# Patient Record
Sex: Female | Born: 1965 | Race: Asian | Hispanic: No | Marital: Married | Smoking: Never smoker
Health system: Southern US, Community
[De-identification: ages and names within clinical notes are randomized; demographics above are authoritative.]

## PROBLEM LIST (undated history)

## (undated) DIAGNOSIS — L659 Nonscarring hair loss, unspecified: Secondary | ICD-10-CM

## (undated) DIAGNOSIS — N838 Other noninflammatory disorders of ovary, fallopian tube and broad ligament: Secondary | ICD-10-CM

## (undated) DIAGNOSIS — E559 Vitamin D deficiency, unspecified: Secondary | ICD-10-CM

## (undated) DIAGNOSIS — R5383 Other fatigue: Secondary | ICD-10-CM

## (undated) DIAGNOSIS — F32A Depression, unspecified: Secondary | ICD-10-CM

## (undated) DIAGNOSIS — N926 Irregular menstruation, unspecified: Secondary | ICD-10-CM

## (undated) DIAGNOSIS — Z86018 Personal history of other benign neoplasm: Secondary | ICD-10-CM

## (undated) DIAGNOSIS — F329 Major depressive disorder, single episode, unspecified: Secondary | ICD-10-CM

## (undated) DIAGNOSIS — F419 Anxiety disorder, unspecified: Secondary | ICD-10-CM

## (undated) DIAGNOSIS — D219 Benign neoplasm of connective and other soft tissue, unspecified: Secondary | ICD-10-CM

## (undated) HISTORY — DX: Personal history of other benign neoplasm: Z86.018

## (undated) HISTORY — DX: Irregular menstruation, unspecified: N92.6

## (undated) HISTORY — DX: Depression, unspecified: F32.A

## (undated) HISTORY — PX: OTHER SURGICAL HISTORY: SHX169

## (undated) HISTORY — DX: Major depressive disorder, single episode, unspecified: F32.9

## (undated) HISTORY — DX: Benign neoplasm of connective and other soft tissue, unspecified: D21.9

## (undated) HISTORY — DX: Anxiety disorder, unspecified: F41.9

## (undated) HISTORY — DX: Nonscarring hair loss, unspecified: L65.9

## (undated) HISTORY — DX: Other noninflammatory disorders of ovary, fallopian tube and broad ligament: N83.8

## (undated) HISTORY — DX: Other fatigue: R53.83

## (undated) HISTORY — DX: Vitamin D deficiency, unspecified: E55.9

---

## 2007-10-17 ENCOUNTER — Ambulatory Visit: Payer: Self-pay | Admitting: Internal Medicine

## 2011-07-04 HISTORY — PX: LAPAROSCOPY: SHX197

## 2012-12-14 LAB — HM PAP SMEAR: HM Pap smear: NORMAL

## 2012-12-25 ENCOUNTER — Ambulatory Visit: Payer: Self-pay | Admitting: Obstetrics and Gynecology

## 2013-01-07 ENCOUNTER — Ambulatory Visit: Payer: Self-pay | Admitting: Obstetrics and Gynecology

## 2014-05-06 ENCOUNTER — Ambulatory Visit: Payer: Self-pay | Admitting: Obstetrics and Gynecology

## 2014-10-20 ENCOUNTER — Ambulatory Visit
Admit: 2014-10-20 | Disposition: A | Discharge status: Home / Self Care | Payer: Self-pay | Attending: Gastroenterology | Admitting: Gastroenterology

## 2014-10-22 ENCOUNTER — Other Ambulatory Visit: Payer: Self-pay | Admitting: Gastroenterology

## 2014-10-22 DIAGNOSIS — R140 Abdominal distension (gaseous): Secondary | ICD-10-CM

## 2014-10-26 LAB — SURGICAL PATHOLOGY

## 2014-10-27 DIAGNOSIS — L659 Nonscarring hair loss, unspecified: Secondary | ICD-10-CM | POA: Insufficient documentation

## 2014-10-27 DIAGNOSIS — D219 Benign neoplasm of connective and other soft tissue, unspecified: Secondary | ICD-10-CM | POA: Insufficient documentation

## 2014-10-27 DIAGNOSIS — R5383 Other fatigue: Secondary | ICD-10-CM | POA: Insufficient documentation

## 2014-11-03 ENCOUNTER — Ambulatory Visit
Admission: RE | Admit: 2014-11-03 | Discharge: 2014-11-03 | Disposition: A | Discharge status: Home / Self Care | Payer: 59 | Source: Ambulatory Visit | Attending: Gastroenterology | Admitting: Gastroenterology

## 2014-11-03 DIAGNOSIS — R140 Abdominal distension (gaseous): Secondary | ICD-10-CM | POA: Insufficient documentation

## 2014-11-03 DIAGNOSIS — R933 Abnormal findings on diagnostic imaging of other parts of digestive tract: Secondary | ICD-10-CM | POA: Insufficient documentation

## 2014-11-03 MED ORDER — TECHNETIUM TC 99M SULFUR COLLOID
2.0000 | Freq: Once | INTRAVENOUS | Status: AC | PRN
  Administered 2014-11-03: 2.08 via ORAL

## 2015-02-01 LAB — HM MAMMOGRAPHY: HM Mammogram: NORMAL

## 2015-02-14 DIAGNOSIS — K3184 Gastroparesis: Secondary | ICD-10-CM | POA: Insufficient documentation

## 2015-09-22 ENCOUNTER — Encounter: Payer: Self-pay | Admitting: Obstetrics and Gynecology

## 2015-09-22 ENCOUNTER — Ambulatory Visit (INDEPENDENT_AMBULATORY_CARE_PROVIDER_SITE_OTHER): Payer: BLUE CROSS/BLUE SHIELD | Admitting: Obstetrics and Gynecology

## 2015-09-22 VITALS — BP 121/85 | HR 112 | Ht 60.0 in | Wt 161.3 lb

## 2015-09-22 DIAGNOSIS — Z01419 Encounter for gynecological examination (general) (routine) without abnormal findings: Secondary | ICD-10-CM

## 2015-09-22 DIAGNOSIS — B373 Candidiasis of vulva and vagina: Secondary | ICD-10-CM

## 2015-09-22 DIAGNOSIS — B3731 Acute candidiasis of vulva and vagina: Secondary | ICD-10-CM

## 2015-09-22 DIAGNOSIS — N951 Menopausal and female climacteric states: Secondary | ICD-10-CM | POA: Insufficient documentation

## 2015-09-22 DIAGNOSIS — Z1211 Encounter for screening for malignant neoplasm of colon: Secondary | ICD-10-CM | POA: Diagnosis not present

## 2015-09-22 DIAGNOSIS — N952 Postmenopausal atrophic vaginitis: Secondary | ICD-10-CM

## 2015-09-22 DIAGNOSIS — Z0000 Encounter for general adult medical examination without abnormal findings: Secondary | ICD-10-CM

## 2015-09-22 MED ORDER — CONJ ESTROG-MEDROXYPROGEST ACE 0.625-2.5 MG PO TABS: 1.0000 | ORAL_TABLET | Freq: Every day | ORAL | Status: DC

## 2015-09-22 MED ORDER — FLUCONAZOLE 150 MG PO TABS: 150.0000 mg | ORAL_TABLET | Freq: Once | ORAL | Status: DC

## 2015-09-22 MED ORDER — FLUCONAZOLE 150 MG PO TABS
150.0000 mg | ORAL_TABLET | Freq: Once | ORAL | Status: DC
Start: 2015-09-22 — End: 2015-09-30

## 2015-09-22 NOTE — Patient Instructions (Signed)
1. Pap smear is done 2. Mammogram is ordered 3. Stool guaiac cards are given for colon cancer screening 4. Diflucan 150 mg orally 1 dose is given for yeast infection 5. Begin hormone therapy Prempro daily 6. Menstrual calendar monitoring to assess for abnormal uterine bleeding 7. Return in 2 months for follow-up 8. Return in 1 year for annual exam 9. Calcium with vitamin D twice a day is recommended.  Medication options include:   Calcium citrate   Citracal   Os-Cal    Viactive chews   Tums

## 2015-09-22 NOTE — Progress Notes (Signed)
GYN ENCOUNTER NOTE  Subjective:       Brenda Bruce is a 50 y.o. G74P1001 female is here for gynecologic evaluation of the following issues:  1. Perimenopausal vasomotor symptoms. 2.  Annual examination.  50 year old [place] female; regular cycles until August of this past year, then no menses until January 2017.  Patient is having significant vasomotor symptoms in the form of hot flashes and night sweats as well as pelvic discomfort with intercourse.  Patient is interested in hormone replacement therapy.    Gynecologic History Patient's last menstrual period was 07/13/2015 (approximate). Contraception: none Last Pap: 12/2012, normal Last mammogram:05/06/2014, BI-RADS 1 History of fibroid uterus; ultrasound 02/05/2013 demonstrating 2 fibroids, each measuring 2.7 centimeters  Obstetric History OB History  Gravida Para Term Preterm AB SAB TAB Ectopic Multiple Living  1 1 1            # Outcome Date GA Lbr Len/2nd Weight Sex Delivery Anes PTL Lv  1 Term 1989   8 lb (3.629 kg) F Vag-Spont         Past Medical History  Diagnosis Date  . Anxiety and depression   . Fatigue   . Fibroid tumor   . Hair loss   . Irregular menses   . Vitamin D deficiency   . History of uterine fibroid   . Ovarian mass, right     Past Surgical History  Procedure Laterality Date  . Intestional tumor      removed  . Laparoscopy  2013    remove tumor in uterus? Dr. in [facility]    No current outpatient prescriptions on file prior to visit.   No current facility-administered medications on file prior to visit.    No Known Allergies  Social History   Social History  . Marital Status: Married    Spouse Name: N/A  . Number of Children: N/A  . Years of Education: N/A   Occupational History  . Not on file.   Social History Main Topics  . Smoking status: Never Smoker   . Smokeless tobacco: Never Used  . Alcohol Use: No  . Drug Use: No  . Sexual Activity: Yes    Birth Control/ Protection: None    Other Topics Concern  . Not on file   Social History Narrative    No family history on file.  The following portions of the patient's history were reviewed and updated as appropriate: allergies, current medications, past family history, past medical history, past social history, past surgical history and problem list.  Review of Systems Review of Systems - General ROS: negative for - chills, fatigue, fever.  POSITIVE-, hot flashes, malaise or night sweats Hematological and Lymphatic ROS: negative for - bleeding problems or swollen lymph nodes Gastrointestinal ROS: negative for - abdominal pain, blood in stools, change in bowel habits and nausea/vomiting Musculoskeletal ROS: negative for - joint pain, muscle pain or muscular weakness Genito-Urinary ROS: negative for - change in menstrual cycle, dysmenorrhea, dyspareunia, dysuria, genital discharge, genital ulcers, hematuria, incontinence, irregular/heavy menses, nocturia or pelvic painjj  Objective:   BP 121/85 mmHg  Pulse 112  Ht 5' (1.524 m)  Wt 161 lb 5 oz (73.171 kg)  BMI 31.50 kg/m2  LMP 07/13/2015 (Approximate) CONSTITUTIONAL: Well-developed, well-nourished female in no acute distress.  HENT:  Normocephalic, atraumatic.  NECK: Normal range of motion, supple, no masses.  Normal thyroid.  SKIN: Skin is warm and dry. No rash noted. Not diaphoretic. No erythema. No pallor. [place]: Alert and oriented to person,  place, and time. PSYCHIATRIC: Normal mood and affect. Normal behavior. Normal judgment and thought content. CARDIOVASCULAR:Not Examined RESPIRATORY: Not Examined BREASTS: Not Examined ABDOMEN: Soft, non distended; Non tender.  No Organomegaly. PELVIC:  External Genitalia: Normal  BUS: Normal  Vagina: Normal; mild decreased estrogen effect; white cheesy discharge  Cervix: Normal; no lesions  Uterus: Normal size, shape,consistency, mobile  Adnexa: Normal  RV: Normal external exam; rectovaginal normal  Bladder:  Nontender MUSCULOSKELETAL: Normal range of motion. No tenderness.  No cyanosis, clubbing, or edema.     Assessment:   1. Well woman exam - Pap IG and HPV (high risk) DNA detection  2. Screening for colon cancer - Fecal Occult Blood, Guaiac  3. Perimenopausal vasomotor symptoms; symptomatic; patient desires HRT  . Vaginal atrophy  5. Yeast vaginitis    Plan:  1.  Begin Prempro 0.625/2.5 mg daily 2.  Return in 2 months for follow-up. 3.  Diflucan 150 mg orally x1 4.  Recommend calcium with vitamin D supplementation  5.  Pap smear completed. 6.   Mammogram ordered 7.  Return in 1 year for routine Annual exam  Duration of office visit was 1 hour with greater than 50% dedicated to counseling and  Management of health issues.. Interpreter  Was utilized.   Brayton Mars, MD'  Note: This dictation was prepared with Dragon dictation along with smaller phrase technology. Any transcriptional errors that result from this process are unintentional.

## 2015-09-24 ENCOUNTER — Other Ambulatory Visit: Payer: Self-pay | Admitting: Obstetrics and Gynecology

## 2015-09-26 LAB — PAP IG AND HPV HIGH-RISK
HPV, high-risk: POSITIVE — AB
PAP Smear Comment: 0

## 2015-09-30 ENCOUNTER — Telehealth: Payer: Self-pay | Admitting: Obstetrics and Gynecology

## 2015-09-30 MED ORDER — CONJ ESTROG-MEDROXYPROGEST ACE 0.625-2.5 MG PO TABS: 1.0000 | ORAL_TABLET | Freq: Every day | ORAL | Status: AC

## 2015-09-30 MED ORDER — FLUCONAZOLE 150 MG PO TABS: 150.0000 mg | ORAL_TABLET | Freq: Once | ORAL | Status: DC

## 2015-09-30 NOTE — Telephone Encounter (Signed)
error 

## 2015-09-30 NOTE — Addendum Note (Signed)
Addended by: Elouise Munroe on: 09/30/2015 02:28 PM   Modules accepted: Orders

## 2015-10-01 ENCOUNTER — Telehealth: Payer: Self-pay

## 2015-10-01 NOTE — Telephone Encounter (Signed)
Pt aware per language line [facility] [place] her f/u appt was changed to conf and colpo. Asked about calcium. Advsied 600mg  bid (oscal). On a side note- yesterday after lunch pts husband stop by office and informed me her rx from annual visit was not at pharmacy. He states he had called 4x and was told they were at pharmacy. Advised I never got a call/message. Meds erx. Pt states she did p/u meds.

## 2015-10-01 NOTE — Telephone Encounter (Signed)
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  Please Schedule colposcopy at patient's follow-up appt on HRT therapy in 2 months

## 2015-10-02 LAB — FECAL OCCULT BLOOD, IMMUNOCHEMICAL: Fecal Occult Bld: NEGATIVE

## 2015-11-24 ENCOUNTER — Encounter: Payer: BLUE CROSS/BLUE SHIELD | Admitting: Obstetrics and Gynecology

## 2015-11-24 ENCOUNTER — Other Ambulatory Visit: Payer: Self-pay | Admitting: Nurse Practitioner

## 2015-11-24 DIAGNOSIS — Z1231 Encounter for screening mammogram for malignant neoplasm of breast: Secondary | ICD-10-CM

## 2015-12-13 ENCOUNTER — Other Ambulatory Visit: Payer: Self-pay | Admitting: Nurse Practitioner

## 2015-12-13 ENCOUNTER — Ambulatory Visit
Admission: RE | Admit: 2015-12-13 | Discharge: 2015-12-13 | Disposition: A | Discharge status: Home / Self Care | Payer: BLUE CROSS/BLUE SHIELD | Source: Ambulatory Visit | Attending: Nurse Practitioner | Admitting: Nurse Practitioner

## 2015-12-13 DIAGNOSIS — Z1231 Encounter for screening mammogram for malignant neoplasm of breast: Secondary | ICD-10-CM | POA: Diagnosis not present

## 2016-09-13 ENCOUNTER — Other Ambulatory Visit: Payer: Self-pay | Admitting: Nurse Practitioner

## 2016-09-13 DIAGNOSIS — Z1231 Encounter for screening mammogram for malignant neoplasm of breast: Secondary | ICD-10-CM

## 2016-12-19 ENCOUNTER — Ambulatory Visit
Admission: RE | Admit: 2016-12-19 | Discharge: 2016-12-19 | Disposition: A | Discharge status: Home / Self Care | Payer: BLUE CROSS/BLUE SHIELD | Source: Ambulatory Visit | Attending: Nurse Practitioner | Admitting: Nurse Practitioner

## 2016-12-19 DIAGNOSIS — Z1231 Encounter for screening mammogram for malignant neoplasm of breast: Secondary | ICD-10-CM | POA: Diagnosis not present

## 2017-10-02 ENCOUNTER — Other Ambulatory Visit: Payer: Self-pay | Admitting: Nurse Practitioner

## 2017-10-02 DIAGNOSIS — Z1231 Encounter for screening mammogram for malignant neoplasm of breast: Secondary | ICD-10-CM

## 2019-05-19 ENCOUNTER — Other Ambulatory Visit: Payer: Self-pay | Admitting: Nurse Practitioner

## 2019-05-19 DIAGNOSIS — Z1231 Encounter for screening mammogram for malignant neoplasm of breast: Secondary | ICD-10-CM

## 2019-05-27 ENCOUNTER — Ambulatory Visit
Admission: RE | Admit: 2019-05-27 | Discharge: 2019-05-27 | Disposition: A | Discharge status: Home / Self Care | Payer: BLUE CROSS/BLUE SHIELD | Source: Ambulatory Visit | Attending: Nurse Practitioner | Admitting: Nurse Practitioner

## 2019-05-27 ENCOUNTER — Other Ambulatory Visit: Payer: Self-pay

## 2019-05-27 DIAGNOSIS — Z1231 Encounter for screening mammogram for malignant neoplasm of breast: Secondary | ICD-10-CM | POA: Diagnosis not present

## 2020-02-23 ENCOUNTER — Encounter: Payer: Self-pay | Admitting: Podiatry

## 2020-02-23 ENCOUNTER — Ambulatory Visit (INDEPENDENT_AMBULATORY_CARE_PROVIDER_SITE_OTHER): Payer: BLUE CROSS/BLUE SHIELD | Admitting: Podiatry

## 2020-02-23 ENCOUNTER — Other Ambulatory Visit: Payer: Self-pay

## 2020-02-23 DIAGNOSIS — L600 Ingrowing nail: Secondary | ICD-10-CM

## 2020-02-23 DIAGNOSIS — M79674 Pain in right toe(s): Secondary | ICD-10-CM | POA: Diagnosis not present

## 2020-02-23 MED ORDER — DOXYCYCLINE HYCLATE 100 MG PO TABS: 100.0000 mg | ORAL_TABLET | Freq: Two times a day (BID) | ORAL | 0 refills | Status: DC

## 2020-02-23 NOTE — Patient Instructions (Signed)

## 2020-02-24 ENCOUNTER — Encounter: Payer: Self-pay | Admitting: Podiatry

## 2020-02-24 NOTE — Progress Notes (Signed)
Subjective:  Patient ID: Brenda Bruce, female    DOB: [birth year],  MRN: [id number]  Chief Complaint  Patient presents with  . Nail Problem    Patient was clipping nail 3 days ago and clipped skin, now right hallux infected    54 y.o. female presents with the above complaint.  Patient presents with right medial ingrown.  Patient states is painful to touch.  Patient would like to have it taken out.  She has not had previous ingrown toenail before.  She has not seen anyone else prior to see me.  Patient's tried soaking in salt water as well as self debridement which has not helped.  She denies any other acute complaints.   Review of Systems: Negative except as noted in the HPI. Denies N/V/F/Ch.  Past Medical History:  Diagnosis Date  . Anxiety and depression   . Fatigue   . Fibroid tumor   . Hair loss   . History of uterine fibroid   . Irregular menses   . Ovarian mass, right   . Vitamin D deficiency     Current Outpatient Medications:  .  Ascorbic Acid (VITAMIN C) 1000 MG tablet, Take by mouth., Disp: , Rfl:  .  cholecalciferol (VITAMIN D) 1000 units tablet, Take 1,000 Units by mouth daily., Disp: , Rfl:  .  doxycycline (VIBRA-TABS) 100 MG tablet, Take 1 tablet (100 mg total) by mouth 2 (two) times daily., Disp: 20 tablet, Rfl: 0 .  estrogen, conjugated,-medroxyprogesterone (PREMPRO) 0.625-2.5 MG tablet, Take 1 tablet by mouth daily., Disp: 30 tablet, Rfl: 2 .  ferrous sulfate 325 (65 FE) MG tablet, Take 325 mg by mouth daily with breakfast., Disp: , Rfl:  .  fluconazole (DIFLUCAN) 150 MG tablet, Take 1 tablet (150 mg total) by mouth once., Disp: 1 tablet, Rfl: 0 .  Multiple Vitamin (MULTI-VITAMINS) TABS, Take by mouth., Disp: , Rfl:  .  Omega-3 Fatty Acids (FISH OIL) 1000 MG CAPS, Take 1 capsule by mouth daily., Disp: , Rfl:  .  omeprazole (PRILOSEC) 40 MG capsule, Take by mouth., Disp: , Rfl:  .  rosuvastatin (CRESTOR) 10 MG tablet, Take 10 mg by mouth at bedtime., Disp: , Rfl:    .  vitamin E 1000 UNIT capsule, Take 1,000 Units by mouth daily., Disp: , Rfl:   Social History   Tobacco Use  Smoking Status Never Smoker  Smokeless Tobacco Never Used    No Known Allergies Objective:  There were no vitals filed for this visit. There is no height or weight on file to calculate BMI. Constitutional Well developed. Well nourished.  Vascular Dorsalis pedis pulses palpable bilaterally. Posterior tibial pulses palpable bilaterally. Capillary refill normal to all digits.  No cyanosis or clubbing noted. Pedal hair growth normal.  Neurologic Normal speech. Oriented to person, place, and time. Epicritic sensation to light touch grossly present bilaterally.  Dermatologic Painful ingrowing nail at medial nail borders of the hallux nail right. No other open wounds. No skin lesions.  Orthopedic: Normal joint ROM without pain or crepitus bilaterally. No visible deformities. No bony tenderness.   Radiographs: None Assessment:  No diagnosis found. Plan:  Patient was evaluated and treated and all questions answered.  Ingrown Nail, right -Patient elects to proceed with minor surgery to remove ingrown toenail removal today. Consent reviewed and signed by patient. -Ingrown nail excised. See procedure note. -Educated on post-procedure care including soaking. Written instructions provided and reviewed. -Patient to follow up in 2 weeks for nail check.  Procedure: Excision  of Ingrown Toenail Location: Right 1st toe medial nail borders. Anesthesia: Lidocaine 1% plain; 1.5 mL and Marcaine 0.5% plain; 1.5 mL, digital block. Skin Prep: Betadine. Dressing: Silvadene; telfa; dry, sterile, compression dressing. Technique: Following skin prep, the toe was exsanguinated and a tourniquet was secured at the base of the toe. The affected nail border was freed, split with a nail splitter, and excised. Chemical matrixectomy was then performed with phenol and irrigated out with alcohol.  The tourniquet was then removed and sterile dressing applied. Disposition: Patient tolerated procedure well. Patient to return in 2 weeks for follow-up.   No follow-ups on file.

## 2020-04-01 ENCOUNTER — Other Ambulatory Visit: Payer: Self-pay | Admitting: Nurse Practitioner

## 2020-04-01 DIAGNOSIS — Z1231 Encounter for screening mammogram for malignant neoplasm of breast: Secondary | ICD-10-CM

## 2020-05-31 ENCOUNTER — Other Ambulatory Visit: Payer: Self-pay

## 2020-05-31 ENCOUNTER — Ambulatory Visit
Admission: RE | Admit: 2020-05-31 | Discharge: 2020-05-31 | Disposition: A | Discharge status: Home / Self Care | Payer: BLUE CROSS/BLUE SHIELD | Source: Ambulatory Visit | Attending: Nurse Practitioner | Admitting: Nurse Practitioner

## 2020-05-31 DIAGNOSIS — Z1231 Encounter for screening mammogram for malignant neoplasm of breast: Secondary | ICD-10-CM | POA: Diagnosis present

## 2022-07-24 DIAGNOSIS — E039 Hypothyroidism, unspecified: Secondary | ICD-10-CM | POA: Diagnosis not present

## 2022-07-24 DIAGNOSIS — Z0001 Encounter for general adult medical examination with abnormal findings: Secondary | ICD-10-CM | POA: Diagnosis not present

## 2022-07-24 DIAGNOSIS — R946 Abnormal results of thyroid function studies: Secondary | ICD-10-CM | POA: Diagnosis not present

## 2022-07-24 DIAGNOSIS — E785 Hyperlipidemia, unspecified: Secondary | ICD-10-CM | POA: Diagnosis not present

## 2022-07-24 DIAGNOSIS — R7303 Prediabetes: Secondary | ICD-10-CM | POA: Diagnosis not present

## 2022-08-17 ENCOUNTER — Ambulatory Visit: Payer: Self-pay | Admitting: Nurse Practitioner

## 2022-08-29 ENCOUNTER — Ambulatory Visit: Payer: Self-pay | Admitting: Nurse Practitioner

## 2022-08-31 ENCOUNTER — Ambulatory Visit: Payer: 59 | Admitting: Nurse Practitioner

## 2022-08-31 VITALS — BP 116/78 | HR 71 | Ht 63.0 in | Wt 164.0 lb

## 2022-08-31 DIAGNOSIS — N951 Menopausal and female climacteric states: Secondary | ICD-10-CM | POA: Diagnosis not present

## 2022-08-31 DIAGNOSIS — E039 Hypothyroidism, unspecified: Secondary | ICD-10-CM | POA: Diagnosis not present

## 2022-08-31 DIAGNOSIS — R5383 Other fatigue: Secondary | ICD-10-CM

## 2022-08-31 DIAGNOSIS — E782 Mixed hyperlipidemia: Secondary | ICD-10-CM

## 2022-08-31 DIAGNOSIS — E079 Disorder of thyroid, unspecified: Secondary | ICD-10-CM | POA: Insufficient documentation

## 2022-08-31 MED ORDER — ROSUVASTATIN CALCIUM 10 MG PO TABS: 10.0000 mg | ORAL_TABLET | Freq: Every day | ORAL | 3 refills | Status: DC

## 2022-08-31 MED ORDER — LEVOTHYROXINE SODIUM 50 MCG PO TABS: 50.0000 ug | ORAL_TABLET | Freq: Every day | ORAL | 11 refills | Status: DC

## 2022-08-31 NOTE — Progress Notes (Signed)
Established Patient Office Visit  Subjective:  Patient ID: Brenda Bruce, female    DOB: 1965/10/28  Age: 57 y.o. MRN: [id number]  Chief Complaint  Patient presents with   Follow-up    Follow up with lab results review.  Patient has promienent nontender mass at her thyroid.  Does not affect her swallowing, nontender, bigger in size, itching.       Past Medical History:  Diagnosis Date   Anxiety and depression    Fatigue    Fibroid tumor    Hair loss    History of uterine fibroid    Irregular menses    Ovarian mass, right    Vitamin D deficiency     Social History   Socioeconomic History   Marital status: Married    Spouse name: Not on file   Number of children: Not on file   Years of education: Not on file   Highest education level: Not on file  Occupational History   Not on file  Tobacco Use   Smoking status: Never   Smokeless tobacco: Never  Substance and Sexual Activity   Alcohol use: No   Drug use: No   Sexual activity: Yes    Birth control/protection: None  Other Topics Concern   Not on file  Social History Narrative   Not on file   Social Determinants of Health   Financial Resource Strain: Not on file  Food Insecurity: Not on file  Transportation Needs: Not on file  Physical Activity: Not on file  Stress: Not on file  Social Connections: Not on file  Intimate Partner Violence: Not on file    Family History  Problem Relation Age of Onset   Breast cancer Neg Hx     No Known Allergies  Review of Systems  Constitutional: Negative.   HENT: Negative.    Eyes: Negative.   Respiratory: Negative.    Cardiovascular: Negative.   Gastrointestinal: Negative.   Genitourinary: Negative.   Musculoskeletal: Negative.   Skin: Negative.   Neurological: Negative.   Endo/Heme/Allergies: Negative.   Psychiatric/Behavioral: Negative.    All other systems reviewed and are negative.      Objective:   BP 116/78   Pulse 71   Ht '5\' 3"'$  (1.6 m)   Wt  164 lb (74.4 kg)   SpO2 97%   BMI 29.05 kg/m   Vitals:   08/31/22 1401  BP: 116/78  Pulse: 71  Height: '5\' 3"'$  (1.6 m)  Weight: 164 lb (74.4 kg)  SpO2: 97%  BMI (Calculated): 29.06    Physical Exam Vitals reviewed.  Constitutional:      Appearance: Normal appearance.  HENT:     Head: Normocephalic.     Nose: Nose normal.     Mouth/Throat:     Mouth: Mucous membranes are moist.  Eyes:     Pupils: Pupils are equal, round, and reactive to light.  Cardiovascular:     Rate and Rhythm: Normal rate and regular rhythm.  Pulmonary:     Effort: Pulmonary effort is normal.     Breath sounds: Normal breath sounds.  Abdominal:     Palpations: Abdomen is soft.  Musculoskeletal:        General: Normal range of motion.     Cervical back: Normal range of motion and neck supple.  Skin:    General: Skin is warm and dry.  Neurological:     Mental Status: She is alert and oriented to person, place, and time.  Psychiatric:  Mood and Affect: Mood normal.        Behavior: Behavior normal.      No results found for any visits on 08/31/22.  No results found for this or any previous visit (from the past 2160 hour(s)).    Assessment & Plan:   Problem List Items Addressed This Visit       Cardiovascular and Mediastinum   Perimenopausal vasomotor symptoms - Primary   Relevant Medications   rosuvastatin (CRESTOR) 10 MG tablet     Other   Fatigue   Thyroid mass   Relevant Orders   US THYROID   Other Visit Diagnoses     Hypothyroidism, adult       Relevant Medications   levothyroxine (SYNTHROID) 50 MCG tablet   Mixed hyperlipidemia       Relevant Medications   rosuvastatin (CRESTOR) 10 MG tablet       Return in about 3 months (around 11/29/2022).   Total time spent: 30 minutes  Evern Bio, NP  08/31/2022

## 2022-08-31 NOTE — Patient Instructions (Signed)
1) Capsaicin cream for left foot burning sensation 2) Thyroid US - nodule/mass 3) Hydrocortisone cream to neck for itching 4) low chol and low carb diet 5) Refill rosuvastatin 6) Increase levothyroxine from 25 mcg to 50 mcg  7) Follow up appt in 3 months, 2 weeks, fasting labs prior

## 2022-09-06 ENCOUNTER — Other Ambulatory Visit: Payer: Self-pay

## 2022-09-13 ENCOUNTER — Ambulatory Visit (INDEPENDENT_AMBULATORY_CARE_PROVIDER_SITE_OTHER): Payer: 59

## 2022-09-13 DIAGNOSIS — E079 Disorder of thyroid, unspecified: Secondary | ICD-10-CM

## 2022-09-26 ENCOUNTER — Telehealth: Payer: Self-pay

## 2022-10-05 IMAGING — MG DIGITAL SCREENING BILAT W/ TOMO W/ CAD
8 series · 8 of 24 positions shown · non-contrast
Comparison: Previous exam(s).

CLINICAL DATA: Screening.

EXAM:
DIGITAL SCREENING BILATERAL MAMMOGRAM WITH TOMO AND CAD

[L MLO synth-2D]
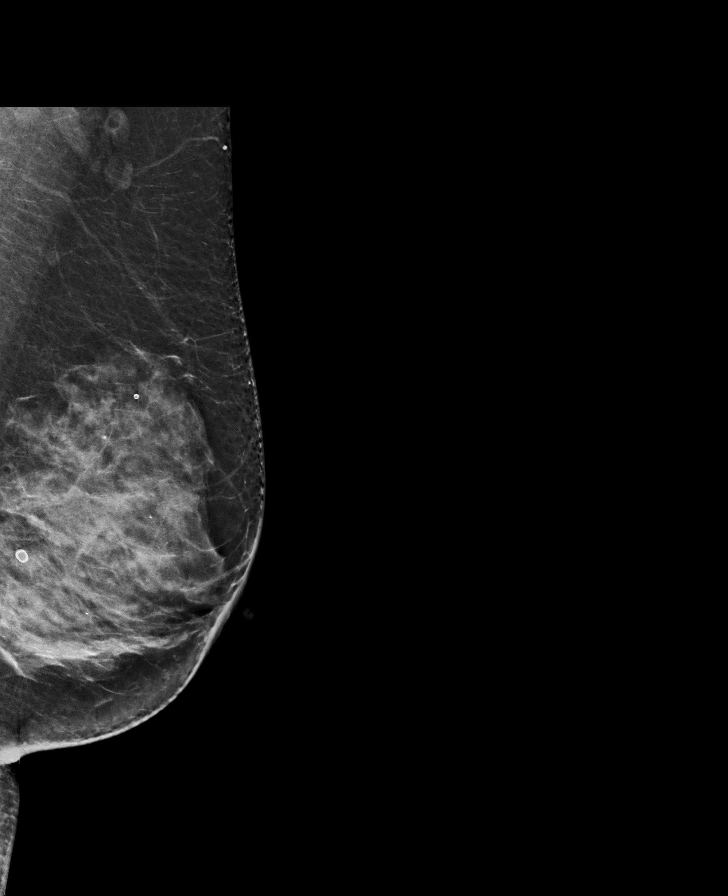

[L CC synth-2D]
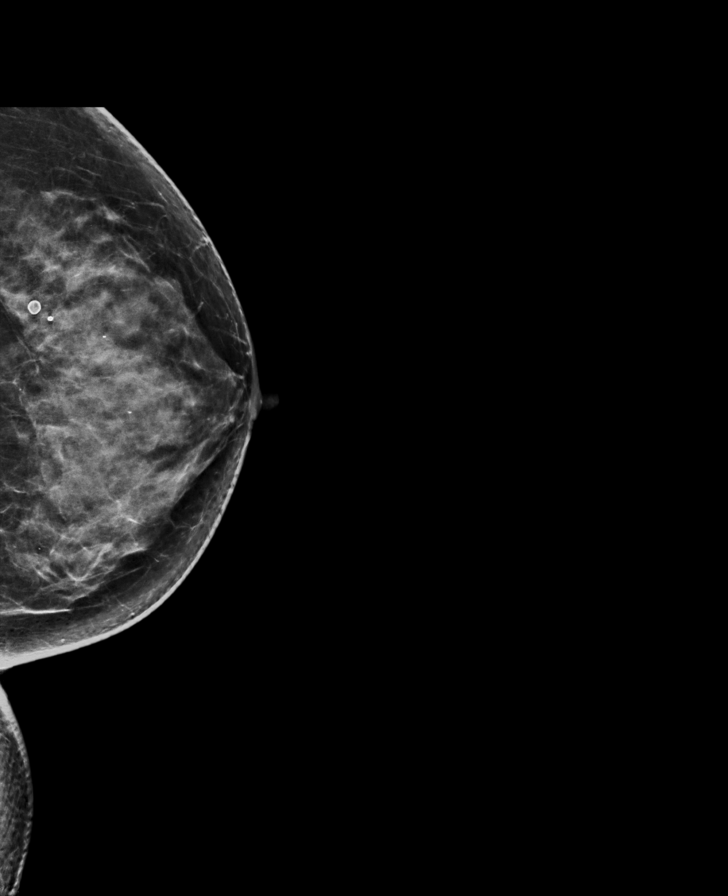

[R CC synth-2D]
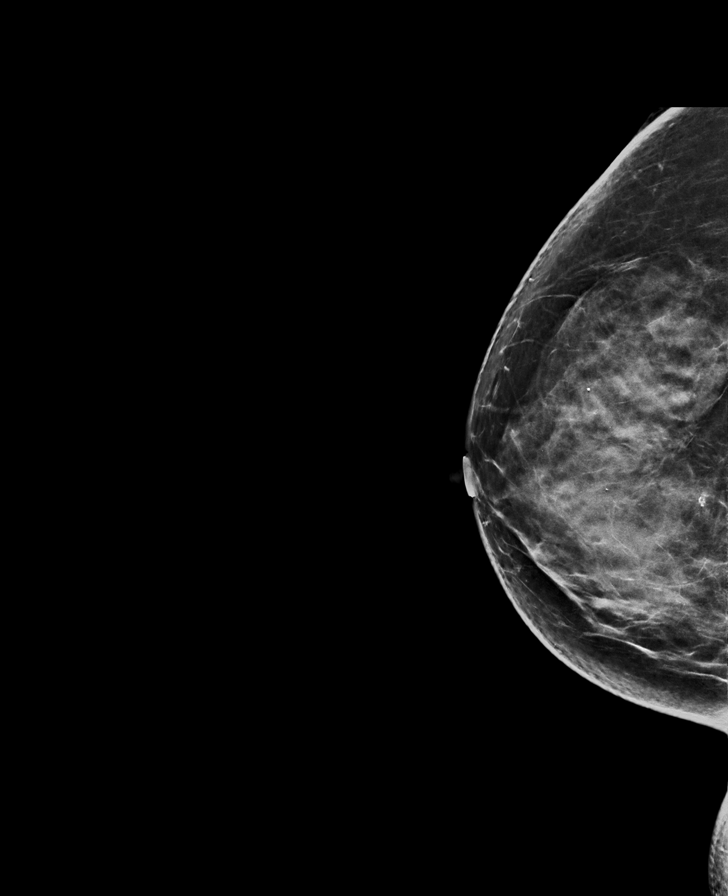

[R MLO synth-2D]
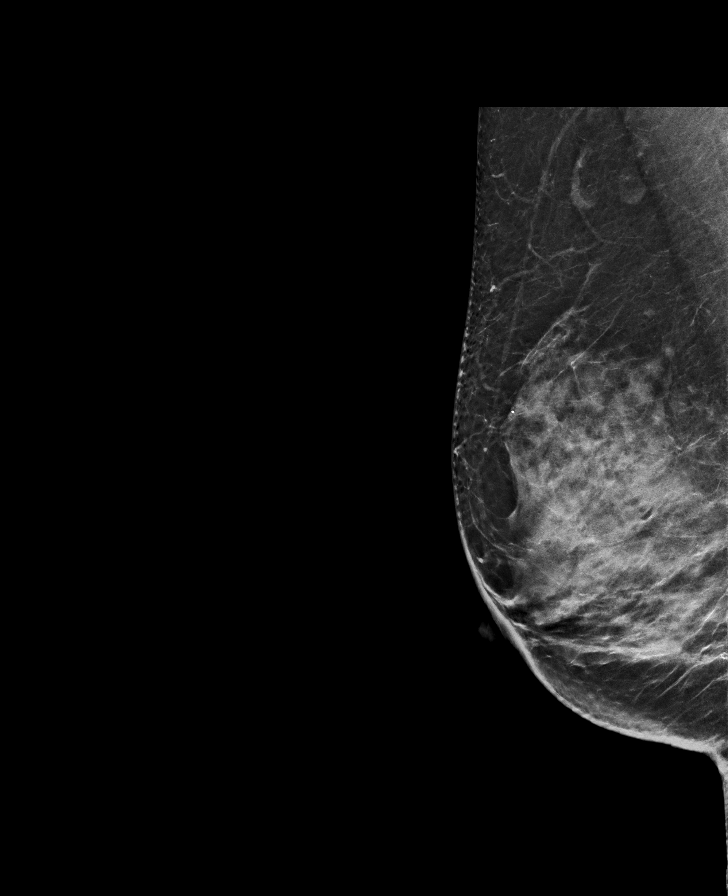

[R MLO tomo · tomo slice 36/71.0]
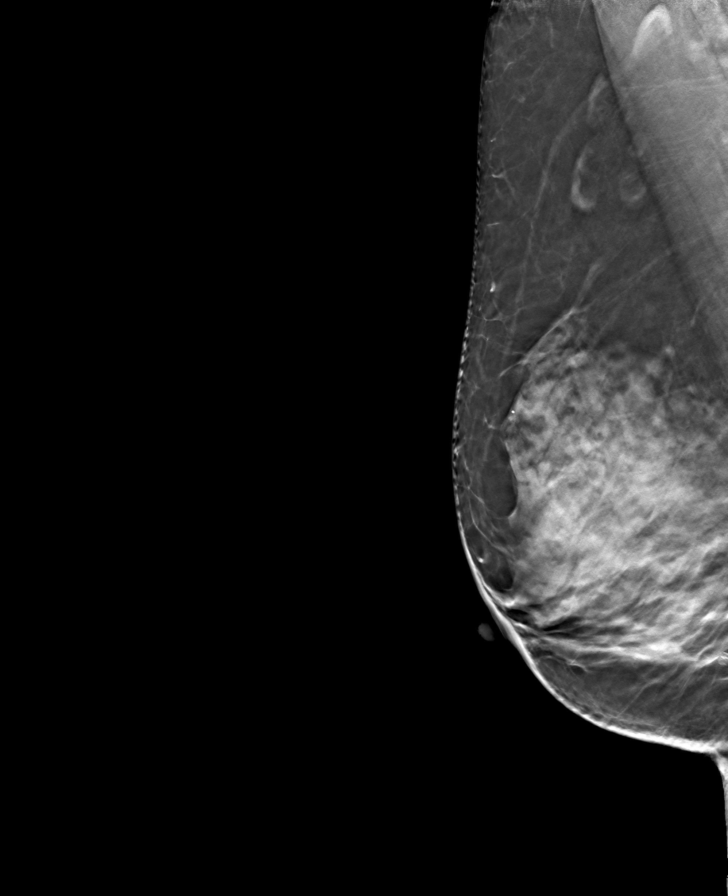

[L CC tomo · tomo slice 37/74.0]
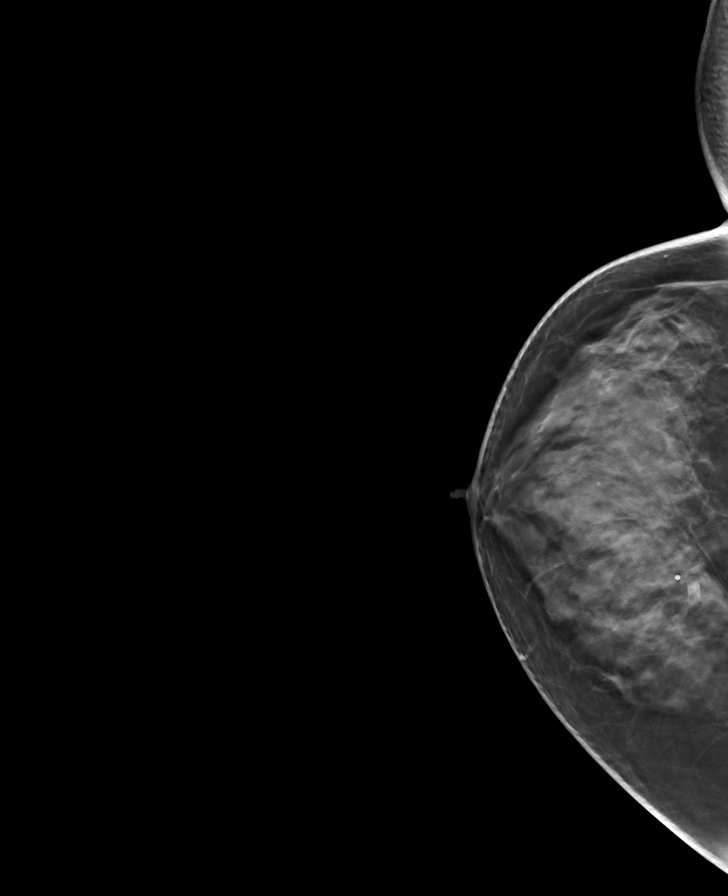

[R CC tomo · tomo slice 37/74.0]
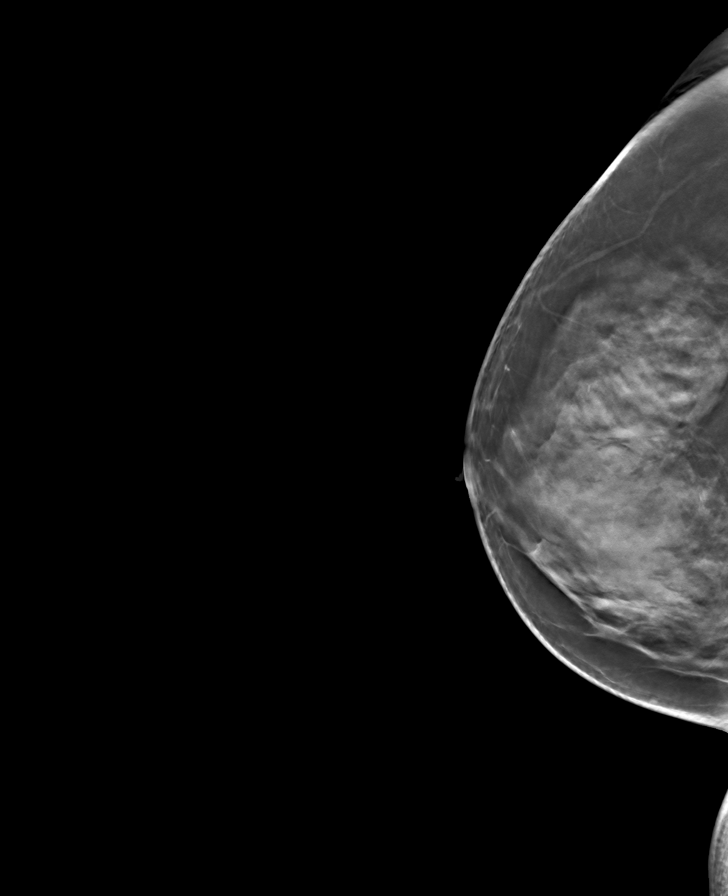

[L MLO tomo · tomo slice 37/74.0]
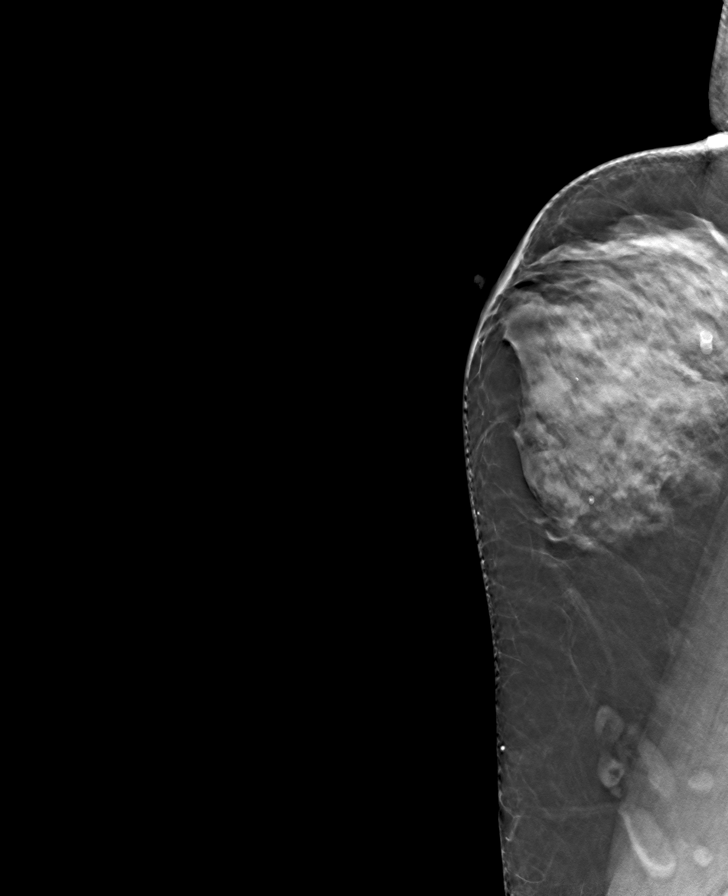

[8 of 24 positions shown; findings below may reference images not displayed]

ACR Breast Density Category d: The breast tissue is extremely dense,
which lowers the sensitivity of mammography
FINDINGS: There are no findings suspicious for malignancy. Images were
processed with CAD.
IMPRESSION: No mammographic evidence of malignancy. A result letter of this
screening mammogram will be mailed directly to the patient.

RECOMMENDATION:
Screening mammogram in one year. (Code:WO-0-ZI0)

BI-RADS CATEGORY  1: Negative.

## 2022-10-11 NOTE — Telephone Encounter (Signed)
Patient informed. 

## 2022-11-28 ENCOUNTER — Ambulatory Visit: Payer: Self-pay | Admitting: Nurse Practitioner

## 2022-11-30 ENCOUNTER — Other Ambulatory Visit: Payer: Self-pay | Admitting: Nurse Practitioner

## 2022-11-30 ENCOUNTER — Other Ambulatory Visit: Payer: Self-pay

## 2022-11-30 DIAGNOSIS — R7303 Prediabetes: Secondary | ICD-10-CM

## 2022-11-30 DIAGNOSIS — R5383 Other fatigue: Secondary | ICD-10-CM | POA: Diagnosis not present

## 2022-11-30 DIAGNOSIS — E782 Mixed hyperlipidemia: Secondary | ICD-10-CM | POA: Diagnosis not present

## 2022-12-01 LAB — LIPID PANEL
Chol/HDL Ratio: 4.1 ratio (ref 0.0–4.4)
Cholesterol, Total: 153 mg/dL (ref 100–199)
HDL: 37 mg/dL — ABNORMAL LOW (ref >39)
LDL Chol Calc (NIH): 95 mg/dL (ref 0–99)
Triglycerides: 115 mg/dL (ref 0–149)
VLDL Cholesterol Cal: 21 mg/dL (ref 5–40)

## 2022-12-01 LAB — CMP14+EGFR
ALT: 15 IU/L (ref 0–32)
AST: 14 IU/L (ref 0–40)
Albumin/Globulin Ratio: 1.6 (ref 1.2–2.2)
Albumin: 4.4 g/dL (ref 3.8–4.9)
Alkaline Phosphatase: 88 IU/L (ref 44–121)
BUN/Creatinine Ratio: 27 — ABNORMAL HIGH (ref 9–23)
BUN: 19 mg/dL (ref 6–24)
Bilirubin Total: 0.4 mg/dL (ref 0.0–1.2)
CO2: 23 mmol/L (ref 20–29)
Calcium: 9.1 mg/dL (ref 8.7–10.2)
Chloride: 109 mmol/L — ABNORMAL HIGH (ref 96–106)
Creatinine, Ser: 0.71 mg/dL (ref 0.57–1.00)
Globulin, Total: 2.8 g/dL (ref 1.5–4.5)
Glucose: 97 mg/dL (ref 70–99)
Potassium: 4.2 mmol/L (ref 3.5–5.2)
Sodium: 145 mmol/L — ABNORMAL HIGH (ref 134–144)
Total Protein: 7.2 g/dL (ref 6.0–8.5)
eGFR: 98 mL/min/{1.73_m2} (ref >59)

## 2022-12-01 LAB — TSH: TSH: 1.66 u[IU]/mL (ref 0.450–4.500)

## 2022-12-01 LAB — HEMOGLOBIN A1C
Est. average glucose Bld gHb Est-mCnc: 123 mg/dL
Hgb A1c MFr Bld: 5.9 % — ABNORMAL HIGH (ref 4.8–5.6)

## 2022-12-05 ENCOUNTER — Ambulatory Visit: Payer: Self-pay | Admitting: Nurse Practitioner

## 2022-12-14 ENCOUNTER — Ambulatory Visit: Payer: Self-pay | Admitting: Nurse Practitioner

## 2022-12-19 ENCOUNTER — Ambulatory Visit: Payer: 59 | Admitting: Nurse Practitioner

## 2022-12-19 VITALS — BP 110/67 | HR 79 | Ht 64.0 in | Wt 159.2 lb

## 2022-12-19 DIAGNOSIS — I10 Essential (primary) hypertension: Secondary | ICD-10-CM

## 2022-12-19 DIAGNOSIS — M25571 Pain in right ankle and joints of right foot: Secondary | ICD-10-CM

## 2022-12-19 DIAGNOSIS — M79671 Pain in right foot: Secondary | ICD-10-CM

## 2022-12-19 DIAGNOSIS — R7303 Prediabetes: Secondary | ICD-10-CM

## 2022-12-19 NOTE — Progress Notes (Signed)
Established Patient Office Visit  Subjective:  Patient ID: Brenda Bruce, female    DOB: February 21, 1966  Age: 57 y.o. MRN: [id number]  Chief Complaint  Patient presents with   Follow-up    3 Months Follow Up    Patient here today for a follow up with lab results review.  A1c at 5.9%, slightly up from last A1c at 5.8%.  LDL is not quite at goal.  BP at goal.  Patient reports running out of her levothyroxine pills but she has 11 refills from her initial bottle back in Feb 2024 so she will pick up at pharmacy.  She also went on vacation one week ago and fell, twisting her right foot and ankle, continues to have some mild edema and tenderness.  Ambulating in sandals with a wedge heel today.    No other concerns at this time.   Past Medical History:  Diagnosis Date   Anxiety and depression    Fatigue    Fibroid tumor    Hair loss    History of uterine fibroid    Irregular menses    Ovarian mass, right    Vitamin D deficiency     Past Surgical History:  Procedure Laterality Date   intestional tumor     removed   LAPAROSCOPY  2013   remove tumor in uterus? Dr. in [facility]    Social History   Socioeconomic History   Marital status: Married    Spouse name: Not on file   Number of children: Not on file   Years of education: Not on file   Highest education level: Not on file  Occupational History   Not on file  Tobacco Use   Smoking status: Never   Smokeless tobacco: Never  Substance and Sexual Activity   Alcohol use: No   Drug use: No   Sexual activity: Yes    Birth control/protection: None  Other Topics Concern   Not on file  Social History Narrative   Not on file   Social Determinants of Health   Financial Resource Strain: Not on file  Food Insecurity: Not on file  Transportation Needs: Not on file  Physical Activity: Not on file  Stress: Not on file  Social Connections: Not on file  Intimate Partner Violence: Not on file    Family History  Problem Relation Age  of Onset   Breast cancer Neg Hx     No Known Allergies  Review of Systems  Constitutional: Negative.   HENT: Negative.    Eyes: Negative.   Respiratory: Negative.    Cardiovascular: Negative.   Gastrointestinal: Negative.   Genitourinary: Negative.   Musculoskeletal:  Positive for myalgias.  Skin: Negative.   Neurological: Negative.   Endo/Heme/Allergies: Negative.   Psychiatric/Behavioral: Negative.         Objective:   BP 110/67   Pulse 79   Ht 5\' 4"  (1.626 m)   Wt 159 lb 3.2 oz (72.2 kg)   SpO2 98%   BMI 27.33 kg/m   Vitals:   12/19/22 1517  BP: 110/67  Pulse: 79  Height: 5\' 4"  (1.626 m)  Weight: 159 lb 3.2 oz (72.2 kg)  SpO2: 98%  BMI (Calculated): 27.31    Physical Exam Vitals and nursing note reviewed.  Constitutional:      Appearance: Normal appearance.  HENT:     Head: Normocephalic.     Nose: Nose normal.     Mouth/Throat:     Mouth: Mucous membranes are moist.  Eyes:     Pupils: Pupils are equal, round, and reactive to light.  Cardiovascular:     Rate and Rhythm: Normal rate and regular rhythm.  Pulmonary:     Effort: Pulmonary effort is normal.     Breath sounds: Normal breath sounds.  Abdominal:     General: Bowel sounds are normal.     Palpations: Abdomen is soft.  Musculoskeletal:        General: Swelling and tenderness present.     Cervical back: Normal range of motion and neck supple.  Skin:    General: Skin is warm and dry.  Neurological:     Mental Status: She is alert and oriented to person, place, and time.  Psychiatric:        Mood and Affect: Mood normal.        Behavior: Behavior normal.      No results found for any visits on 12/19/22.  Recent Results (from the past 2160 hour(s))  Hemoglobin A1c     Status: Abnormal   Collection Time: 11/30/22  4:32 PM  Result Value Ref Range   Hgb A1c MFr Bld 5.9 (H) 4.8 - 5.6 %    Comment:          Prediabetes: 5.7 - 6.4          Diabetes: >6.4          Glycemic control for  adults with diabetes: <7.0    Est. average glucose Bld gHb Est-mCnc 123 mg/dL  TSH     Status: None   Collection Time: 11/30/22  4:32 PM  Result Value Ref Range   TSH 1.660 0.450 - 4.500 uIU/mL  CMP14+EGFR     Status: Abnormal   Collection Time: 11/30/22  4:32 PM  Result Value Ref Range   Glucose 97 70 - 99 mg/dL   BUN 19 6 - 24 mg/dL   Creatinine, Ser 1.61 0.57 - 1.00 mg/dL   eGFR 98 >09 UE/AVW/0.98   BUN/Creatinine Ratio 27 (H) 9 - 23   Sodium 145 (H) 134 - 144 mmol/L   Potassium 4.2 3.5 - 5.2 mmol/L   Chloride 109 (H) 96 - 106 mmol/L   CO2 23 20 - 29 mmol/L   Calcium 9.1 8.7 - 10.2 mg/dL   Total Protein 7.2 6.0 - 8.5 g/dL   Albumin 4.4 3.8 - 4.9 g/dL   Globulin, Total 2.8 1.5 - 4.5 g/dL   Albumin/Globulin Ratio 1.6 1.2 - 2.2   Bilirubin Total 0.4 0.0 - 1.2 mg/dL   Alkaline Phosphatase 88 44 - 121 IU/L   AST 14 0 - 40 IU/L   ALT 15 0 - 32 IU/L  Lipid panel     Status: Abnormal   Collection Time: 11/30/22  4:32 PM  Result Value Ref Range   Cholesterol, Total 153 100 - 199 mg/dL   Triglycerides 119 0 - 149 mg/dL   HDL 37 (L) >14 mg/dL   VLDL Cholesterol Cal 21 5 - 40 mg/dL   LDL Chol Calc (NIH) 95 0 - 99 mg/dL   Chol/HDL Ratio 4.1 0.0 - 4.4 ratio    Comment:                                   T. Chol/HDL Ratio  Men  Women                               1/2 Avg.Risk  3.4    3.3                                   Avg.Risk  5.0    4.4                                2X Avg.Risk  9.6    7.1                                3X Avg.Risk 23.4   11.0       Assessment & Plan:  1) Right ankle and foot xrays for fall, injury, pain, mild edema 2) Pt will pick up levothyroxine refill 3) Cut back on conc sugars, carbs 4) Follow up appt in 5 months, fasting labs prior Problem List Items Addressed This Visit       Cardiovascular and Mediastinum   Essential hypertension, benign     Other   Acute right ankle pain   Relevant Orders   DG  Ankle Complete Right   DG Foot Complete Right   Foot pain, right - Primary   Relevant Orders   DG Ankle Complete Right   DG Foot Complete Right   Prediabetes    Return in about 5 months (around 05/21/2023) for Right ankle and right foot xrays - pain.   Total time spent: 35 minutes  Orson Eva, NP  12/19/2022   This document may have been prepared by [employer] Voice Recognition software and as such may include unintentional dictation errors.

## 2022-12-21 ENCOUNTER — Ambulatory Visit (INDEPENDENT_AMBULATORY_CARE_PROVIDER_SITE_OTHER): Payer: 59

## 2022-12-21 ENCOUNTER — Ambulatory Visit: Payer: 59

## 2022-12-21 DIAGNOSIS — M79671 Pain in right foot: Secondary | ICD-10-CM | POA: Diagnosis not present

## 2022-12-21 DIAGNOSIS — M25571 Pain in right ankle and joints of right foot: Secondary | ICD-10-CM | POA: Diagnosis not present

## 2022-12-21 NOTE — Progress Notes (Signed)
Patient notified

## 2023-01-13 DIAGNOSIS — M3500 Sicca syndrome, unspecified: Secondary | ICD-10-CM | POA: Diagnosis not present

## 2023-01-13 DIAGNOSIS — I10 Essential (primary) hypertension: Secondary | ICD-10-CM | POA: Diagnosis not present

## 2023-01-13 DIAGNOSIS — E785 Hyperlipidemia, unspecified: Secondary | ICD-10-CM | POA: Diagnosis not present

## 2023-01-13 DIAGNOSIS — E039 Hypothyroidism, unspecified: Secondary | ICD-10-CM | POA: Diagnosis not present

## 2023-01-17 DIAGNOSIS — S82831A Other fracture of upper and lower end of right fibula, initial encounter for closed fracture: Secondary | ICD-10-CM | POA: Diagnosis not present

## 2023-05-22 ENCOUNTER — Ambulatory Visit: Payer: Self-pay | Admitting: Cardiology

## 2023-06-22 ENCOUNTER — Ambulatory Visit: Payer: Self-pay | Admitting: Cardiology

## 2023-06-28 ENCOUNTER — Ambulatory Visit: Payer: Self-pay | Admitting: Cardiology

## 2023-06-29 ENCOUNTER — Other Ambulatory Visit: Payer: Self-pay

## 2023-06-29 DIAGNOSIS — E782 Mixed hyperlipidemia: Secondary | ICD-10-CM

## 2023-06-29 DIAGNOSIS — R7303 Prediabetes: Secondary | ICD-10-CM

## 2023-06-29 DIAGNOSIS — I10 Essential (primary) hypertension: Secondary | ICD-10-CM

## 2023-06-29 DIAGNOSIS — E039 Hypothyroidism, unspecified: Secondary | ICD-10-CM

## 2023-06-30 LAB — CMP14+EGFR
ALT: 43 [IU]/L — ABNORMAL HIGH (ref 0–32)
AST: 26 [IU]/L (ref 0–40)
Albumin: 4.5 g/dL (ref 3.8–4.9)
Alkaline Phosphatase: 87 [IU]/L (ref 44–121)
BUN/Creatinine Ratio: 26 — ABNORMAL HIGH (ref 9–23)
BUN: 17 mg/dL (ref 6–24)
Bilirubin Total: 0.5 mg/dL (ref 0.0–1.2)
CO2: 23 mmol/L (ref 20–29)
Calcium: 9.5 mg/dL (ref 8.7–10.2)
Chloride: 102 mmol/L (ref 96–106)
Creatinine, Ser: 0.66 mg/dL (ref 0.57–1.00)
Globulin, Total: 2.7 g/dL (ref 1.5–4.5)
Glucose: 91 mg/dL (ref 70–99)
Potassium: 4.4 mmol/L (ref 3.5–5.2)
Sodium: 140 mmol/L (ref 134–144)
Total Protein: 7.2 g/dL (ref 6.0–8.5)
eGFR: 101 mL/min/{1.73_m2} (ref >59)

## 2023-06-30 LAB — HEMOGLOBIN A1C
Est. average glucose Bld gHb Est-mCnc: 114 mg/dL
Hgb A1c MFr Bld: 5.6 % (ref 4.8–5.6)

## 2023-06-30 LAB — LIPID PANEL
Chol/HDL Ratio: 5.5 {ratio} — ABNORMAL HIGH (ref 0.0–4.4)
Cholesterol, Total: 202 mg/dL — ABNORMAL HIGH (ref 100–199)
HDL: 37 mg/dL — ABNORMAL LOW (ref >39)
LDL Chol Calc (NIH): 139 mg/dL — ABNORMAL HIGH (ref 0–99)
Triglycerides: 142 mg/dL (ref 0–149)
VLDL Cholesterol Cal: 26 mg/dL (ref 5–40)

## 2023-06-30 LAB — TSH: TSH: 2.75 u[IU]/mL (ref 0.450–4.500)

## 2023-07-05 ENCOUNTER — Ambulatory Visit (INDEPENDENT_AMBULATORY_CARE_PROVIDER_SITE_OTHER): Payer: No Typology Code available for payment source | Admitting: Cardiology

## 2023-07-05 ENCOUNTER — Encounter: Payer: Self-pay | Admitting: Cardiology

## 2023-07-05 VITALS — BP 118/74 | HR 73 | Ht 61.0 in | Wt 160.4 lb

## 2023-07-05 DIAGNOSIS — E039 Hypothyroidism, unspecified: Secondary | ICD-10-CM | POA: Insufficient documentation

## 2023-07-05 DIAGNOSIS — I10 Essential (primary) hypertension: Secondary | ICD-10-CM

## 2023-07-05 DIAGNOSIS — Z1231 Encounter for screening mammogram for malignant neoplasm of breast: Secondary | ICD-10-CM

## 2023-07-05 NOTE — Progress Notes (Signed)
 Established Patient Office Visit  Subjective:  Patient ID: Brenda Bruce, female    DOB: 1966/06/04  Age: 58 y.o. MRN: [id number]  Chief Complaint  Patient presents with   Follow-up    Patient in office for 5 month follow up, discuss recent lab work. No current complaints. Patient just returned from [place], as not been taking her statin. LDL elevated. Hgb A1c stable.  Due for mammogram, will send order in,     No other concerns at this time.   Past Medical History:  Diagnosis Date   Anxiety and depression    Fatigue    Fibroid tumor    Hair loss    History of uterine fibroid    Irregular menses    Ovarian mass, right    Vitamin D deficiency     Past Surgical History:  Procedure Laterality Date   intestional tumor     removed   LAPAROSCOPY  2013   remove tumor in uterus? Dr. in [facility]    Social History   Socioeconomic History   Marital status: Married    Spouse name: Not on file   Number of children: Not on file   Years of education: Not on file   Highest education level: Not on file  Occupational History   Not on file  Tobacco Use   Smoking status: Never   Smokeless tobacco: Never  Substance and Sexual Activity   Alcohol use: No   Drug use: No   Sexual activity: Yes    Birth control/protection: None  Other Topics Concern   Not on file  Social History Narrative   Not on file   Social Drivers of Health   Financial Resource Strain: Not on file  Food Insecurity: Not on file  Transportation Needs: Not on file  Physical Activity: Not on file  Stress: Not on file  Social Connections: Not on file  Intimate Partner Violence: Not on file    Family History  Problem Relation Age of Onset   Breast cancer Neg Hx     No Known Allergies  Outpatient Medications Prior to Visit  Medication Sig   cholecalciferol (VITAMIN D) 1000 units tablet Take 1,000 Units by mouth daily.   estrogen, conjugated,-medroxyprogesterone (PREMPRO) 0.625-2.5 MG tablet Take 1  tablet by mouth daily.   levothyroxine  (SYNTHROID ) 50 MCG tablet Take 1 tablet (50 mcg total) by mouth daily.   Multiple Vitamin (MULTI-VITAMINS) TABS Take by mouth.   Omega-3 Fatty Acids (FISH OIL) 1000 MG CAPS Take 1 capsule by mouth daily.   omeprazole  (PRILOSEC) 40 MG capsule Take by mouth.   rosuvastatin  (CRESTOR ) 10 MG tablet Take 1 tablet (10 mg total) by mouth at bedtime.   vitamin E 1000 UNIT capsule Take 1,000 Units by mouth daily.   [DISCONTINUED] Ascorbic Acid (VITAMIN C) 1000 MG tablet Take by mouth. (Patient not taking: Reported on 08/31/2022)   [DISCONTINUED] doxycycline  (VIBRA -TABS) 100 MG tablet Take 1 tablet (100 mg total) by mouth 2 (two) times daily. (Patient not taking: Reported on 08/31/2022)   [DISCONTINUED] ferrous sulfate 325 (65 FE) MG tablet Take 325 mg by mouth daily with breakfast. (Patient not taking: Reported on 08/31/2022)   [DISCONTINUED] fluconazole  (DIFLUCAN ) 150 MG tablet Take 1 tablet (150 mg total) by mouth once. (Patient not taking: Reported on 08/31/2022)   No facility-administered medications prior to visit.    Review of Systems  Constitutional: Negative.   HENT: Negative.    Eyes: Negative.   Respiratory: Negative.  Negative for  shortness of breath.   Cardiovascular: Negative.  Negative for chest pain.  Gastrointestinal: Negative.  Negative for abdominal pain, constipation and diarrhea.  Genitourinary: Negative.   Musculoskeletal:  Negative for joint pain and myalgias.  Skin: Negative.   Neurological: Negative.  Negative for dizziness and headaches.  Endo/Heme/Allergies: Negative.   All other systems reviewed and are negative.      Objective:   BP 118/74   Pulse 73   Ht 5' 1 (1.549 m)   Wt 160 lb 6.4 oz (72.8 kg)   SpO2 98%   BMI 30.31 kg/m   Vitals:   07/05/23 1145  BP: 118/74  Pulse: 73  Height: 5' 1 (1.549 m)  Weight: 160 lb 6.4 oz (72.8 kg)  SpO2: 98%  BMI (Calculated): 30.32    Physical Exam Vitals and nursing note  reviewed.  Constitutional:      Appearance: Normal appearance. She is normal weight.  HENT:     Head: Normocephalic and atraumatic.     Nose: Nose normal.     Mouth/Throat:     Mouth: Mucous membranes are moist.  Eyes:     Extraocular Movements: Extraocular movements intact.     Conjunctiva/sclera: Conjunctivae normal.     Pupils: Pupils are equal, round, and reactive to light.  Cardiovascular:     Rate and Rhythm: Normal rate and regular rhythm.     Pulses: Normal pulses.     Heart sounds: Normal heart sounds.  Pulmonary:     Effort: Pulmonary effort is normal.     Breath sounds: Normal breath sounds.  Abdominal:     General: Abdomen is flat. Bowel sounds are normal.     Palpations: Abdomen is soft.  Musculoskeletal:        General: Normal range of motion.     Cervical back: Normal range of motion.  Skin:    General: Skin is warm and dry.  Neurological:     General: No focal deficit present.     Mental Status: She is alert and oriented to person, place, and time.  Psychiatric:        Mood and Affect: Mood normal.        Behavior: Behavior normal.        Thought Content: Thought content normal.        Judgment: Judgment normal.      No results found for any visits on 07/05/23.  Recent Results (from the past 2160 hours)  CMP14+EGFR     Status: Abnormal   Collection Time: 06/29/23  1:31 PM  Result Value Ref Range   Glucose 91 70 - 99 mg/dL   BUN 17 6 - 24 mg/dL   Creatinine, Ser 9.33 0.57 - 1.00 mg/dL   eGFR 898 >40 fO/fpw/8.26   BUN/Creatinine Ratio 26 (H) 9 - 23   Sodium 140 134 - 144 mmol/L   Potassium 4.4 3.5 - 5.2 mmol/L   Chloride 102 96 - 106 mmol/L   CO2 23 20 - 29 mmol/L   Calcium  9.5 8.7 - 10.2 mg/dL   Total Protein 7.2 6.0 - 8.5 g/dL   Albumin 4.5 3.8 - 4.9 g/dL   Globulin, Total 2.7 1.5 - 4.5 g/dL   Bilirubin Total 0.5 0.0 - 1.2 mg/dL   Alkaline Phosphatase 87 44 - 121 IU/L   AST 26 0 - 40 IU/L   ALT 43 (H) 0 - 32 IU/L  Hemoglobin A1c      Status: None   Collection Time: 06/29/23  1:33 PM  Result Value Ref Range   Hgb A1c MFr Bld 5.6 4.8 - 5.6 %    Comment:          Prediabetes: 5.7 - 6.4          Diabetes: >6.4          Glycemic control for adults with diabetes: <7.0    Est. average glucose Bld gHb Est-mCnc 114 mg/dL  TSH     Status: None   Collection Time: 06/29/23  1:33 PM  Result Value Ref Range   TSH 2.750 0.450 - 4.500 uIU/mL  Lipid panel     Status: Abnormal   Collection Time: 06/29/23  1:33 PM  Result Value Ref Range   Cholesterol, Total 202 (H) 100 - 199 mg/dL   Triglycerides 857 0 - 149 mg/dL   HDL 37 (L) >60 mg/dL   VLDL Cholesterol Cal 26 5 - 40 mg/dL   LDL Chol Calc (NIH) 860 (H) 0 - 99 mg/dL   Chol/HDL Ratio 5.5 (H) 0.0 - 4.4 ratio    Comment:                                   T. Chol/HDL Ratio                                             Men  Women                               1/2 Avg.Risk  3.4    3.3                                   Avg.Risk  5.0    4.4                                2X Avg.Risk  9.6    7.1                                3X Avg.Risk 23.4   11.0       Assessment & Plan:  Restart statin.  Mammogram order sent.   Problem List Items Addressed This Visit       Cardiovascular and Mediastinum   Essential hypertension, benign - Primary     Endocrine   Hypothyroidism, adult   Other Visit Diagnoses       Encounter for screening mammogram for malignant neoplasm of breast       Relevant Orders   MM 3D SCREENING MAMMOGRAM BILATERAL BREAST       Return in about 4 months (around 11/02/2023) for with fasting labs prior.   Total time spent: 25 minutes  Google, NP  07/05/2023   This document may have been prepared by Dragon Voice Recognition software and as such may include unintentional dictation errors.

## 2023-10-22 ENCOUNTER — Other Ambulatory Visit: Payer: Self-pay

## 2023-10-22 DIAGNOSIS — E039 Hypothyroidism, unspecified: Secondary | ICD-10-CM

## 2023-10-22 DIAGNOSIS — E782 Mixed hyperlipidemia: Secondary | ICD-10-CM

## 2023-10-22 MED ORDER — ROSUVASTATIN CALCIUM 10 MG PO TABS: 10.0000 mg | ORAL_TABLET | Freq: Every day | ORAL | 3 refills | Status: DC

## 2023-10-22 MED ORDER — LEVOTHYROXINE SODIUM 50 MCG PO TABS: 50.0000 ug | ORAL_TABLET | Freq: Every day | ORAL | 11 refills | Status: AC

## 2023-11-06 ENCOUNTER — Other Ambulatory Visit

## 2023-11-06 ENCOUNTER — Ambulatory Visit: Payer: No Typology Code available for payment source | Admitting: Cardiology

## 2023-11-06 DIAGNOSIS — R5383 Other fatigue: Secondary | ICD-10-CM

## 2023-11-06 DIAGNOSIS — R7303 Prediabetes: Secondary | ICD-10-CM

## 2023-11-06 DIAGNOSIS — E782 Mixed hyperlipidemia: Secondary | ICD-10-CM

## 2023-11-06 DIAGNOSIS — E079 Disorder of thyroid, unspecified: Secondary | ICD-10-CM

## 2023-11-06 DIAGNOSIS — E039 Hypothyroidism, unspecified: Secondary | ICD-10-CM

## 2023-11-06 DIAGNOSIS — I10 Essential (primary) hypertension: Secondary | ICD-10-CM

## 2023-11-07 LAB — LIPID PANEL
Chol/HDL Ratio: 4.5 ratio — ABNORMAL HIGH (ref 0.0–4.4)
Cholesterol, Total: 163 mg/dL (ref 100–199)
HDL: 36 mg/dL — ABNORMAL LOW (ref >39)
LDL Chol Calc (NIH): 99 mg/dL (ref 0–99)
Triglycerides: 162 mg/dL — ABNORMAL HIGH (ref 0–149)
VLDL Cholesterol Cal: 28 mg/dL (ref 5–40)

## 2023-11-07 LAB — CMP14+EGFR
ALT: 23 IU/L (ref 0–32)
AST: 21 IU/L (ref 0–40)
Albumin: 4.7 g/dL (ref 3.8–4.9)
Alkaline Phosphatase: 80 IU/L (ref 44–121)
BUN/Creatinine Ratio: 22 (ref 9–23)
BUN: 16 mg/dL (ref 6–24)
Bilirubin Total: 0.5 mg/dL (ref 0.0–1.2)
CO2: 22 mmol/L (ref 20–29)
Calcium: 9.5 mg/dL (ref 8.7–10.2)
Chloride: 103 mmol/L (ref 96–106)
Creatinine, Ser: 0.74 mg/dL (ref 0.57–1.00)
Globulin, Total: 2.5 g/dL (ref 1.5–4.5)
Glucose: 102 mg/dL — ABNORMAL HIGH (ref 70–99)
Potassium: 4.1 mmol/L (ref 3.5–5.2)
Sodium: 140 mmol/L (ref 134–144)
Total Protein: 7.2 g/dL (ref 6.0–8.5)
eGFR: 93 mL/min/{1.73_m2} (ref >59)

## 2023-11-07 LAB — TSH: TSH: 1.83 u[IU]/mL (ref 0.450–4.500)

## 2023-11-07 LAB — HEMOGLOBIN A1C
Est. average glucose Bld gHb Est-mCnc: 117 mg/dL
Hgb A1c MFr Bld: 5.7 % — ABNORMAL HIGH (ref 4.8–5.6)

## 2023-11-12 ENCOUNTER — Encounter: Payer: Self-pay | Admitting: Cardiology

## 2023-11-12 ENCOUNTER — Ambulatory Visit: Admitting: Cardiology

## 2023-11-12 VITALS — BP 105/82 | HR 83 | Ht 61.0 in | Wt 164.6 lb

## 2023-11-12 DIAGNOSIS — E039 Hypothyroidism, unspecified: Secondary | ICD-10-CM

## 2023-11-12 DIAGNOSIS — R7303 Prediabetes: Secondary | ICD-10-CM | POA: Diagnosis not present

## 2023-11-12 DIAGNOSIS — E782 Mixed hyperlipidemia: Secondary | ICD-10-CM | POA: Diagnosis not present

## 2023-11-12 DIAGNOSIS — I10 Essential (primary) hypertension: Secondary | ICD-10-CM | POA: Diagnosis not present

## 2023-11-12 DIAGNOSIS — R109 Unspecified abdominal pain: Secondary | ICD-10-CM

## 2023-11-12 NOTE — Progress Notes (Signed)
 Established Patient Office Visit  Subjective:  Patient ID: Brenda Bruce, female    DOB: 1966-01-19  Age: 58 y.o. MRN: [id number]  Chief Complaint  Patient presents with   Follow-up    4 month lab results    Patient in office for 4 month follow up, discuss recent lab results. Patient complains of abdominal pain. Patient seen by GI in 2016 for nondiabetic gastroparesis by [facility]. Will send new referral.  Discussed recent lab work. LDL at goal. Hgb A1c elevated, pre diabetic.  Continue same medications.     No other concerns at this time.   Past Medical History:  Diagnosis Date   Anxiety and depression    Fatigue    Fibroid tumor    Hair loss    History of uterine fibroid    Irregular menses    Ovarian mass, right    Vitamin D deficiency     Past Surgical History:  Procedure Laterality Date   intestional tumor     removed   LAPAROSCOPY  2013   remove tumor in uterus? Dr. in [facility]    Social History   Socioeconomic History   Marital status: Married    Spouse name: Not on file   Number of children: Not on file   Years of education: Not on file   Highest education level: Not on file  Occupational History   Not on file  Tobacco Use   Smoking status: Never   Smokeless tobacco: Never  Substance and Sexual Activity   Alcohol use: No   Drug use: No   Sexual activity: Yes    Birth control/protection: None  Other Topics Concern   Not on file  Social History Narrative   Not on file   Social Drivers of Health   Financial Resource Strain: Not on file  Food Insecurity: Not on file  Transportation Needs: Not on file  Physical Activity: Not on file  Stress: Not on file  Social Connections: Not on file  Intimate Partner Violence: Not on file    Family History  Problem Relation Age of Onset   Breast cancer Neg Hx     No Known Allergies  Outpatient Medications Prior to Visit  Medication Sig   cholecalciferol (VITAMIN D) 1000 units tablet Take 1,000 Units by  mouth daily.   estrogen, conjugated,-medroxyprogesterone (PREMPRO) 0.625-2.5 MG tablet Take 1 tablet by mouth daily.   levothyroxine  (SYNTHROID ) 50 MCG tablet Take 1 tablet (50 mcg total) by mouth daily.   Multiple Vitamin (MULTI-VITAMINS) TABS Take by mouth.   Omega-3 Fatty Acids (FISH OIL) 1000 MG CAPS Take 1 capsule by mouth daily.   omeprazole (PRILOSEC) 40 MG capsule Take by mouth.   rosuvastatin  (CRESTOR ) 10 MG tablet Take 1 tablet (10 mg total) by mouth at bedtime.   vitamin E 1000 UNIT capsule Take 1,000 Units by mouth daily.   No facility-administered medications prior to visit.    Review of Systems  Constitutional: Negative.   HENT: Negative.    Eyes: Negative.   Respiratory: Negative.  Negative for shortness of breath.   Cardiovascular: Negative.  Negative for chest pain.  Gastrointestinal:  Positive for abdominal pain. Negative for constipation and diarrhea.  Genitourinary: Negative.   Musculoskeletal:  Negative for joint pain and myalgias.  Skin: Negative.   Neurological: Negative.  Negative for dizziness and headaches.  Endo/Heme/Allergies: Negative.   All other systems reviewed and are negative.      Objective:   BP 105/82   Pulse  83   Ht 5\' 1"  (1.549 m)   Wt 164 lb 9 oz (74.6 kg)   SpO2 97%   BMI 31.09 kg/m   Vitals:   11/12/23 1357  BP: 105/82  Pulse: 83  Height: 5\' 1"  (1.549 m)  Weight: 164 lb 9 oz (74.6 kg)  SpO2: 97%  BMI (Calculated): 31.11    Physical Exam Vitals and nursing note reviewed.  Constitutional:      Appearance: Normal appearance. She is normal weight.  HENT:     Head: Normocephalic and atraumatic.     Nose: Nose normal.     Mouth/Throat:     Mouth: Mucous membranes are moist.  Eyes:     Extraocular Movements: Extraocular movements intact.     Conjunctiva/sclera: Conjunctivae normal.     Pupils: Pupils are equal, round, and reactive to light.  Cardiovascular:     Rate and Rhythm: Normal rate and regular rhythm.      Pulses: Normal pulses.     Heart sounds: Normal heart sounds.  Pulmonary:     Effort: Pulmonary effort is normal.     Breath sounds: Normal breath sounds.  Abdominal:     General: Abdomen is flat. Bowel sounds are normal.     Palpations: Abdomen is soft.  Musculoskeletal:        General: Normal range of motion.     Cervical back: Normal range of motion.  Skin:    General: Skin is warm and dry.  Neurological:     General: No focal deficit present.     Mental Status: She is alert and oriented to person, place, and time.  Psychiatric:        Mood and Affect: Mood normal.        Behavior: Behavior normal.        Thought Content: Thought content normal.        Judgment: Judgment normal.      No results found for any visits on 11/12/23.  Recent Results (from the past 2160 hours)  Hemoglobin A1c     Status: Abnormal   Collection Time: 11/06/23 10:36 AM  Result Value Ref Range   Hgb A1c MFr Bld 5.7 (H) 4.8 - 5.6 %    Comment:          Prediabetes: 5.7 - 6.4          Diabetes: >6.4          Glycemic control for adults with diabetes: <7.0    Est. average glucose Bld gHb Est-mCnc 117 mg/dL  TSH     Status: None   Collection Time: 11/06/23 10:36 AM  Result Value Ref Range   TSH 1.830 0.450 - 4.500 uIU/mL  CMP14+EGFR     Status: Abnormal   Collection Time: 11/06/23 10:36 AM  Result Value Ref Range   Glucose 102 (H) 70 - 99 mg/dL   BUN 16 6 - 24 mg/dL   Creatinine, Ser 1.61 0.57 - 1.00 mg/dL   eGFR 93 >09 UE/AVW/0.98   BUN/Creatinine Ratio 22 9 - 23   Sodium 140 134 - 144 mmol/L   Potassium 4.1 3.5 - 5.2 mmol/L   Chloride 103 96 - 106 mmol/L   CO2 22 20 - 29 mmol/L   Calcium  9.5 8.7 - 10.2 mg/dL   Total Protein 7.2 6.0 - 8.5 g/dL   Albumin 4.7 3.8 - 4.9 g/dL   Globulin, Total 2.5 1.5 - 4.5 g/dL   Bilirubin Total 0.5 0.0 - 1.2 mg/dL  Alkaline Phosphatase 80 44 - 121 IU/L   AST 21 0 - 40 IU/L   ALT 23 0 - 32 IU/L  Lipid panel     Status: Abnormal   Collection Time:  11/06/23 10:36 AM  Result Value Ref Range   Cholesterol, Total 163 100 - 199 mg/dL   Triglycerides 696 (H) 0 - 149 mg/dL   HDL 36 (L) >29 mg/dL   VLDL Cholesterol Cal 28 5 - 40 mg/dL   LDL Chol Calc (NIH) 99 0 - 99 mg/dL   Chol/HDL Ratio 4.5 (H) 0.0 - 4.4 ratio    Comment:                                   T. Chol/HDL Ratio                                             Men  Women                               1/2 Avg.Risk  3.4    3.3                                   Avg.Risk  5.0    4.4                                2X Avg.Risk  9.6    7.1                                3X Avg.Risk 23.4   11.0       Assessment & Plan:  Continue same medications. Referral sent to GI.  Problem List Items Addressed This Visit       Cardiovascular and Mediastinum   Essential hypertension, benign - Primary     Endocrine   Hypothyroidism, adult     Other   Prediabetes   Mixed hyperlipidemia   Other Visit Diagnoses       Stomach pain       Relevant Orders   Ambulatory referral to Gastroenterology       Return in about 4 months (around 03/14/2024) for with fasting labs prior.   Total time spent: 25 minutes  Google, NP  11/12/2023   This document may have been prepared by Dragon Voice Recognition software and as such may include unintentional dictation errors.

## 2024-01-17 ENCOUNTER — Encounter: Payer: Self-pay | Admitting: Gastroenterology

## 2024-03-11 ENCOUNTER — Other Ambulatory Visit (INDEPENDENT_AMBULATORY_CARE_PROVIDER_SITE_OTHER)

## 2024-03-11 ENCOUNTER — Other Ambulatory Visit: Payer: Self-pay | Admitting: Gastroenterology

## 2024-03-11 ENCOUNTER — Encounter: Payer: Self-pay | Admitting: Gastroenterology

## 2024-03-11 ENCOUNTER — Ambulatory Visit: Payer: Self-pay | Admitting: Gastroenterology

## 2024-03-11 VITALS — BP 122/70 | HR 86 | Ht 61.0 in | Wt 164.0 lb

## 2024-03-11 DIAGNOSIS — Z1211 Encounter for screening for malignant neoplasm of colon: Secondary | ICD-10-CM

## 2024-03-11 DIAGNOSIS — R1030 Lower abdominal pain, unspecified: Secondary | ICD-10-CM | POA: Diagnosis not present

## 2024-03-11 DIAGNOSIS — R1013 Epigastric pain: Secondary | ICD-10-CM

## 2024-03-11 DIAGNOSIS — Z800 Family history of malignant neoplasm of digestive organs: Secondary | ICD-10-CM

## 2024-03-11 LAB — CBC WITH DIFFERENTIAL/PLATELET
Basophils Absolute: 0 K/uL (ref 0.0–0.1)
Basophils Relative: 0.5 % (ref 0.0–3.0)
Eosinophils Absolute: 0.1 K/uL (ref 0.0–0.7)
Eosinophils Relative: 2.4 % (ref 0.0–5.0)
HCT: 37.1 % (ref 36.0–46.0)
Hemoglobin: 12.3 g/dL (ref 12.0–15.0)
Lymphocytes Relative: 32.4 % (ref 12.0–46.0)
Lymphs Abs: 1.6 K/uL (ref 0.7–4.0)
MCHC: 33 g/dL (ref 30.0–36.0)
MCV: 91.7 fl (ref 78.0–100.0)
Monocytes Absolute: 0.4 K/uL (ref 0.1–1.0)
Monocytes Relative: 7.4 % (ref 3.0–12.0)
Neutro Abs: 2.8 K/uL (ref 1.4–7.7)
Neutrophils Relative %: 57.3 % (ref 43.0–77.0)
Platelets: 222 K/uL (ref 150.0–400.0)
RBC: 4.05 Mil/uL (ref 3.87–5.11)
RDW: 13 % (ref 11.5–15.5)
WBC: 4.8 K/uL (ref 4.0–10.5)

## 2024-03-11 LAB — COMPREHENSIVE METABOLIC PANEL WITH GFR
ALT: 20 U/L (ref 0–35)
AST: 17 U/L (ref 0–37)
Albumin: 4.7 g/dL (ref 3.5–5.2)
Alkaline Phosphatase: 65 U/L (ref 39–117)
BUN: 13 mg/dL (ref 6–23)
CO2: 29 meq/L (ref 19–32)
Calcium: 9.7 mg/dL (ref 8.4–10.5)
Chloride: 104 meq/L (ref 96–112)
Creatinine, Ser: 0.61 mg/dL (ref 0.40–1.20)
GFR: 97.54 mL/min (ref >60.00)
Glucose, Bld: 105 mg/dL — ABNORMAL HIGH (ref 70–99)
Potassium: 4 meq/L (ref 3.5–5.1)
Sodium: 140 meq/L (ref 135–145)
Total Bilirubin: 0.6 mg/dL (ref 0.2–1.2)
Total Protein: 7.8 g/dL (ref 6.0–8.3)

## 2024-03-11 LAB — SEDIMENTATION RATE: Sed Rate: 11 mm/h (ref 0–30)

## 2024-03-11 LAB — HIGH SENSITIVITY CRP: CRP, High Sensitivity: 0.82 mg/L (ref 0.000–5.000)

## 2024-03-11 LAB — LIPASE: Lipase: 33 U/L (ref 11.0–59.0)

## 2024-03-11 MED ORDER — NA SULFATE-K SULFATE-MG SULF 17.5-3.13-1.6 GM/177ML PO SOLN: 1.0000 | Freq: Once | ORAL | 0 refills | Status: AC

## 2024-03-11 MED ORDER — OMEPRAZOLE 40 MG PO CPDR: 40.0000 mg | DELAYED_RELEASE_CAPSULE | Freq: Every day | ORAL | 3 refills | Status: AC

## 2024-03-11 NOTE — Patient Instructions (Signed)
 You have been scheduled for an endoscopy and colonoscopy. Please follow the written instructions given to you at your visit today.  If you use inhalers (even only as needed), please bring them with you on the day of your procedure.  DO NOT TAKE 7 DAYS PRIOR TO TEST- Trulicity (dulaglutide) Ozempic, Wegovy (semaglutide) Mounjaro (tirzepatide) Bydureon Bcise (exanatide extended release)  DO NOT TAKE 1 DAY PRIOR TO YOUR TEST Rybelsus (semaglutide) Adlyxin (lixisenatide) Victoza (liraglutide) Byetta (exanatide) ___________________________________________________________________________   Your provider has requested that you go to the basement level for lab work before leaving today. Press B on the elevator. The lab is located at the first door on the left as you exit the elevator.   We have sent the following medications to your pharmacy for you to pick up at your convenience: Omeprazole  Suprep  Eliminate dairy from diet  Due to recent changes in healthcare laws, you may see the results of your imaging and laboratory studies on MyChart before your provider has had a chance to review them.  We understand that in some cases there may be results that are confusing or concerning to you. Not all laboratory results come back in the same time frame and the provider may be waiting for multiple results in order to interpret others.  Please give us  48 hours in order for your provider to thoroughly review all the results before contacting the office for clarification of your results.    I appreciate the  opportunity to care for you  Thank You   Camie Heinz,PA-C

## 2024-03-11 NOTE — Progress Notes (Signed)
 Brenda Bruce [id number] 1965-11-25   Chief Complaint: Abdominal pain,   Referring Provider: Carin Gauze, NP Primary GI MD: Sampson  HPI: Brenda Bruce is a 58 y.o. female with past medical history of anxiety/depression, fatigue, who presents today for a complaint of abdominal pain.    Previously seen at [facility] clinic.  Prior EGD showed small food in the stomach and duodenum with biopsies showing only mild gastritis.  Gastric emptying study showed mild gastroparesis with 78% emptying at 4 hours.  At last visit 02/2015 patient endorsed trouble with belching and bloating and gas in epigastrium.  Advised nutrition consult for gastroparesis, low FODMAP diet, probiotic, simethicone with meals, and repeat EGD in 3 to 5 years given first-degree relative with stomach cancer.  Seen by PCP 11/12/2023, complaint of abdominal pain at that time and referred to GI for further evaluation.   Patient here today with her daughter who speaks [place] and helps interpret for patient, whose primary language is Mandarin.   Patient states she has been having multiple GI symptoms for about a year.  Endorses intermittent upper abdominal pain and gas which improves when she takes omeprazole  on a daily basis.  After discontinuing omeprazole  her symptoms return.  She denies any acid reflux or heartburn.  Epigastric pain is localized without radiation.  She denies any nausea, vomiting, dysphagia.  Still has her gallbladder.  Also endorses intermittent lower abdominal pain.  This just occurs occasionally and feels like gas pains.  Often when she experiences pain she feels the urge to have a bowel movement.  Her symptoms improve after a bowel movement.  Pain seems to be triggered by consuming cold drinks or ice cream.  Has identified certain dietary triggers.  Denies any blood in her stool or melena.  Denies any diarrhea or constipation, and when she experiences pain followed by a bowel movement her stools are still  formed.  Her mother died of stomach cancer, after being diagnosed at age 71.  Patient denies any family history of colon cancer.    Patient is unsure whether she has ever had a colonoscopy, but states that if she did it would have been over 10 years ago.  Previous GI Procedures/Imaging   Gastric emptying study 11/03/2014 Normal gastric emptying study (though noted to have 78% emptied at 4 hr)  EGD 10/20/2014  Path: DIAGNOSIS:  A. STOMACH, ANTRUM; BIOPSY:  - ANTRAL MUCOSA WITH MODERATE FOVEOLAR HYPERPLASIA AND MILD CHRONIC  GASTRITIS.  - NEGATIVE FOR H PYLORI, DYSPLASIA AND MALIGNANCY.   B. STOMACH, BODY; BIOPSY:  - OXYNTIC MUCOSA WITH MILD CHRONIC GASTRITIS.  - NEGATIVE FOR H.PYLORI, DYSPLASIA, AND MALIGNANCY.   C. GASTRIC FUNDUS; BIOPSY:  - FUNDIC TYPE MUCOSA WITH MINIMAL TO MILD CHRONIC GASTRITIS.  - NEGATIVE FOR H.PYLORI, DYSPLASIA, AND MALIGNANCY.   Past Medical History:  Diagnosis Date   Anxiety and depression    Fatigue    Fibroid tumor    Hair loss    History of uterine fibroid    Irregular menses    Ovarian mass, right    Vitamin D deficiency     Past Surgical History:  Procedure Laterality Date   intestional tumor     removed   LAPAROSCOPY  2013   remove tumor in uterus? Dr. in [facility]    Current Outpatient Medications  Medication Sig Dispense Refill   cholecalciferol (VITAMIN D) 1000 units tablet Take 1,000 Units by mouth daily.     estrogen, conjugated,-medroxyprogesterone (PREMPRO) 0.625-2.5 MG tablet Take 1 tablet  by mouth daily. 30 tablet 2   levothyroxine  (SYNTHROID ) 50 MCG tablet Take 1 tablet (50 mcg total) by mouth daily. 30 tablet 11   Multiple Vitamin (MULTI-VITAMINS) TABS Take by mouth.     Omega-3 Fatty Acids (FISH OIL) 1000 MG CAPS Take 1 capsule by mouth daily.     rosuvastatin  (CRESTOR ) 10 MG tablet Take 1 tablet (10 mg total) by mouth at bedtime. 90 tablet 3   vitamin E 1000 UNIT capsule Take 1,000 Units by mouth daily.     omeprazole   (PRILOSEC) 40 MG capsule Take by mouth. (Patient not taking: Reported on 03/11/2024)     No current facility-administered medications for this visit.    Allergies as of 03/11/2024   (No Known Allergies)    Family History  Problem Relation Age of Onset   Breast cancer Neg Hx     Social History   Tobacco Use   Smoking status: Never   Smokeless tobacco: Never  Vaping Use   Vaping status: Never Used  Substance Use Topics   Alcohol use: No   Drug use: No     Review of Systems:    Constitutional: No unintentional weight loss, fever, chills Cardiovascular: No chest pain Respiratory: No SOB  Gastrointestinal: See HPI and otherwise negative Hematologic: No bleeding     Physical Exam:  Vital signs: BP 122/70   Pulse 86   Ht 5' 1 (1.549 m)   Wt 164 lb (74.4 kg)   BMI 30.99 kg/m   Constitutional: Pleasant, overweight female in NAD alert and cooperative Head:  Normocephalic and atraumatic.  Eyes: No scleral icterus.  Respiratory: Respirations even and unlabored. Lungs clear to auscultation bilaterally.  No wheezes, crackles, or rhonchi.  Cardiovascular:  Regular rate and rhythm. No murmurs. No peripheral edema. Gastrointestinal:  Soft, nondistended, nontender. No rebound or guarding. Normal bowel sounds. No appreciable masses or hepatomegaly. Rectal:  Not performed.  Neurologic:  Alert and oriented x4;  grossly normal neurologically.  Skin:   Dry and intact without significant lesions or rashes. Psychiatric: Oriented to person, place and time. Demonstrates good judgement and reason without abnormal affect or behaviors.   RELEVANT LABS AND IMAGING: CBC No results found for: WBC, RBC, HGB, HCT, PLT, MCV, MCH, MCHC, RDW, LYMPHSABS, MONOABS, EOSABS, BASOSABS  CMP     Component Value Date/Time   NA 140 11/06/2023 1036   K 4.1 11/06/2023 1036   CL 103 11/06/2023 1036   CO2 22 11/06/2023 1036   GLUCOSE 102 (H) 11/06/2023 1036   BUN 16 11/06/2023  1036   CREATININE 0.74 11/06/2023 1036   CALCIUM  9.5 11/06/2023 1036   PROT 7.2 11/06/2023 1036   ALBUMIN 4.7 11/06/2023 1036   AST 21 11/06/2023 1036   ALT 23 11/06/2023 1036   ALKPHOS 80 11/06/2023 1036   BILITOT 0.5 11/06/2023 1036     Assessment/Plan:   Epigastric abdominal pain Family history of stomach cancer Patient reports intermittent epigastric abdominal pain/gas which has been ongoing for about a year.  Her symptoms do improve if she takes omeprazole  but when she discontinues the medication her symptoms return.  She is not having any acid reflux, heartburn, nausea, vomiting. Does have history of upper endoscopy 2016 which, per chart review, showed only mild gastritis and some food in the stomach and duodenum.  Path negative for H. pylori.   Follow-up gastric emptying study showed 78% emptying at 4 hours, normal versus mild gastroparesis. Patient does have family history of stomach cancer in her  mother who was diagnosed at age 82, and following EGD in 2016 was advised to have repeat in 3 to 5 years.  - Schedule EGD. I thoroughly discussed the procedure with the patient to include nature of the procedure, alternatives, benefits, and risks (including but not limited to bleeding, infection, perforation, anesthesia/cardiac/pulmonary complications). Patient verbalized understanding and gave verbal consent to proceed with procedure.  - Start omeprazole  40 mg daily - Labs today: CBC, CMP, lipase - If symptoms persist/worsen and no explanation on EGD, consider CT scan  Intermittent lower abdominal pain Patient reports occasional lower abdominal pain/gas pain that seems to be triggered by eating dairy/cold foods and which is relieved after a bowel movement.  No associated diarrhea, rectal bleeding, melena.  Suspect possible IBS, possibly diet-related.  - Labs: TTG, IgA, CRP, ESR - Recommend trial of dairy elimination - Will give handout on low FODMAP diet - Consider trial of  antispasmodic  Screening for colon cancer  - Schedule colonoscopy. I thoroughly discussed the procedure with the patient to include nature of the procedure, alternatives, benefits, and risks (including but not limited to bleeding, infection, perforation, anesthesia/cardiac/pulmonary complications). Patient verbalized understanding and gave verbal consent to proceed with procedure.    Camie Furbish, PA-C [facility] 03/11/2024, 10:18 AM  Patient Care Team: Carin Gauze, NP as PCP - General (Cardiology)

## 2024-03-12 ENCOUNTER — Ambulatory Visit: Payer: Self-pay | Admitting: Gastroenterology

## 2024-03-12 LAB — IGA: Immunoglobulin A: 318 mg/dL — ABNORMAL HIGH (ref 47–310)

## 2024-03-12 LAB — TISSUE TRANSGLUTAMINASE, IGA: (tTG) Ab, IgA: <1 U/mL

## 2024-03-18 ENCOUNTER — Ambulatory Visit: Admitting: Cardiology

## 2024-04-08 ENCOUNTER — Ambulatory Visit (AMBULATORY_SURGERY_CENTER): Admitting: Gastroenterology

## 2024-04-08 ENCOUNTER — Encounter: Payer: Self-pay | Admitting: Gastroenterology

## 2024-04-08 VITALS — BP 106/59 | HR 66 | Temp 97.8°F | Resp 12 | Ht 61.0 in | Wt 164.0 lb

## 2024-04-08 DIAGNOSIS — K5730 Diverticulosis of large intestine without perforation or abscess without bleeding: Secondary | ICD-10-CM

## 2024-04-08 DIAGNOSIS — R1013 Epigastric pain: Secondary | ICD-10-CM

## 2024-04-08 DIAGNOSIS — Z1211 Encounter for screening for malignant neoplasm of colon: Secondary | ICD-10-CM | POA: Diagnosis present

## 2024-04-08 DIAGNOSIS — K648 Other hemorrhoids: Secondary | ICD-10-CM | POA: Diagnosis not present

## 2024-04-08 DIAGNOSIS — D122 Benign neoplasm of ascending colon: Secondary | ICD-10-CM

## 2024-04-08 DIAGNOSIS — K2950 Unspecified chronic gastritis without bleeding: Secondary | ICD-10-CM | POA: Diagnosis not present

## 2024-04-08 DIAGNOSIS — K644 Residual hemorrhoidal skin tags: Secondary | ICD-10-CM | POA: Diagnosis not present

## 2024-04-08 DIAGNOSIS — K3189 Other diseases of stomach and duodenum: Secondary | ICD-10-CM | POA: Diagnosis not present

## 2024-04-08 DIAGNOSIS — K2970 Gastritis, unspecified, without bleeding: Secondary | ICD-10-CM

## 2024-04-08 MED ORDER — SODIUM CHLORIDE 0.9 % IV SOLN: 500.0000 mL | Freq: Once | INTRAVENOUS | Status: DC

## 2024-04-08 NOTE — Progress Notes (Unsigned)
 Report to PACU, RN, vss, BBS= Clear.

## 2024-04-08 NOTE — Op Note (Signed)
 [facility] Patient Name: Brenda Bruce Procedure Date: 04/08/2024 3:49 PM MRN: [id number] Endoscopist: Gustav ALONSO Mcgee , MD, [id number] Age: 58 Referring MD:  Date of Birth: 1965-07-29 Gender: Female Account #: [id number] Procedure:                Colonoscopy Indications:              Screening for colorectal malignant neoplasm Medicines:                Monitored Anesthesia Care Procedure:                Pre-Anesthesia Assessment:                           - Prior to the procedure, a History and Physical                            was performed, and patient medications and                            allergies were reviewed. The patient's tolerance of                            previous anesthesia was also reviewed. The risks                            and benefits of the procedure and the sedation                            options and risks were discussed with the patient.                            All questions were answered, and informed consent                            was obtained. Prior Anticoagulants: The patient has                            taken no anticoagulant or antiplatelet agents. ASA                            Grade Assessment: II - A patient with mild systemic                            disease. After reviewing the risks and benefits,                            the patient was deemed in satisfactory condition to                            undergo the procedure.                           After obtaining informed consent, the colonoscope  was passed under direct vision. Throughout the                            procedure, the patient's blood pressure, pulse, and                            oxygen saturations were monitored continuously. The                            [id number]-[id number] Colonoscope [id number] was introduced                            through the anus and advanced to the the cecum,                            identified by  appendiceal orifice and ileocecal                            valve. The colonoscopy was performed without                            difficulty. The patient tolerated the procedure                            well. The quality of the bowel preparation was                            good. The ileocecal valve, appendiceal orifice, and                            rectum were photographed. Scope In: 4:03:11 PM Scope Out: 4:13:11 PM Scope Withdrawal Time: 0 hours 6 minutes 59 seconds  Total Procedure Duration: 0 hours 10 minutes 0 seconds  Findings:                 The perianal and digital rectal examinations were                            normal.                           Two sessile polyps were found in the ascending                            colon. The polyps were 2 to 3 mm in size. These                            polyps were removed with a cold snare. Resection                            and retrieval were complete.                           Scattered small-mouthed diverticula were found in  the sigmoid colon and descending colon.                           Non-bleeding external and internal hemorrhoids were                            found during retroflexion. The hemorrhoids were                            medium-sized. Complications:            No immediate complications. Estimated Blood Loss:     Estimated blood loss was minimal. Impression:               - Two 2 to 3 mm polyps in the ascending colon,                            removed with a cold snare. Resected and retrieved.                           - Diverticulosis in the sigmoid colon and in the                            descending colon.                           - Non-bleeding external and internal hemorrhoids. Recommendation:           - Patient has a contact number available for                            emergencies. The signs and symptoms of potential                            delayed  complications were discussed with the                            patient. Return to normal activities tomorrow.                            Written discharge instructions were provided to the                            patient.                           - Resume previous diet.                           - Continue present medications.                           - Await pathology results.                           - Repeat colonoscopy in 5-10 years for surveillance  based on pathology results. Elexius Minar V. Kinya Meine, MD 04/08/2024 4:17:25 PM This report has been signed electronically.

## 2024-04-08 NOTE — Progress Notes (Signed)
 Called to room to assist during endoscopic procedure.  Patient ID and intended procedure confirmed with present staff. Received instructions for my participation in the procedure from the performing physician.

## 2024-04-08 NOTE — Patient Instructions (Signed)
 Discharge instructions given. Handouts on polyps,Diverticulosis,Hemorrhoids, and Gastritis. Resume previous medications. YOU HAD AN ENDOSCOPIC PROCEDURE TODAY AT THE [facility]:   Refer to the procedure report that was given to you for any specific questions about what was found during the examination.  If the procedure report does not answer your questions, please call your gastroenterologist to clarify.  If you requested that your care partner not be given the details of your procedure findings, then the procedure report has been included in a sealed envelope for you to review at your convenience later.  YOU SHOULD EXPECT: Some feelings of bloating in the abdomen. Passage of more gas than usual.  Walking can help get rid of the air that was put into your GI tract during the procedure and reduce the bloating. If you had a lower endoscopy (such as a colonoscopy or flexible sigmoidoscopy) you may notice spotting of blood in your stool or on the toilet paper. If you underwent a bowel prep for your procedure, you may not have a normal bowel movement for a few days.  Please Note:  You might notice some irritation and congestion in your nose or some drainage.  This is from the oxygen used during your procedure.  There is no need for concern and it should clear up in a day or so.  SYMPTOMS TO REPORT IMMEDIATELY:  Following lower endoscopy (colonoscopy or flexible sigmoidoscopy):  Excessive amounts of blood in the stool  Significant tenderness or worsening of abdominal pains  Swelling of the abdomen that is new, acute  Fever of 100F or higher  Following upper endoscopy (EGD)  Vomiting of blood or coffee ground material  New chest pain or pain under the shoulder blades  Painful or persistently difficult swallowing  New shortness of breath  Fever of 100F or higher  Black, tarry-looking stools  For urgent or emergent issues, a gastroenterologist can be reached at any hour by calling  [phone]. Do not use MyChart messaging for urgent concerns.    DIET:  We do recommend a small meal at first, but then you may proceed to your regular diet.  Drink plenty of fluids but you should avoid alcoholic beverages for 24 hours.  ACTIVITY:  You should plan to take it easy for the rest of today and you should NOT DRIVE or use heavy machinery until tomorrow (because of the sedation medicines used during the test).    FOLLOW UP: Our staff will call the number listed on your records the next business day following your procedure.  We will call around 7:15- 8:00 am to check on you and address any questions or concerns that you may have regarding the information given to you following your procedure. If we do not reach you, we will leave a message.     If any biopsies were taken you will be contacted by phone or by letter within the next 1-3 weeks.  Please call us  at [phone] if you have not heard about the biopsies in 3 weeks.    SIGNATURES/CONFIDENTIALITY: You and/or your care partner have signed paperwork which will be entered into your electronic medical record.  These signatures attest to the fact that that the information above on your After Visit Summary has been reviewed and is understood.  Full responsibility of the confidentiality of this discharge information lies with you and/or your care-partner.

## 2024-04-08 NOTE — Op Note (Signed)
 [facility] Patient Name: Brenda Bruce Procedure Date: 04/08/2024 3:49 PM MRN: [id number] Endoscopist: Gustav ALONSO Mcgee , MD, [id number] Age: 58 Referring MD:  Date of Birth: 10-16-1965 Gender: Female Account #: [id number] Procedure:                Upper GI endoscopy Indications:              Epigastric abdominal pain Medicines:                Monitored Anesthesia Care Procedure:                Pre-Anesthesia Assessment:                           - Prior to the procedure, a History and Physical                            was performed, and patient medications and                            allergies were reviewed. The patient's tolerance of                            previous anesthesia was also reviewed. The risks                            and benefits of the procedure and the sedation                            options and risks were discussed with the patient.                            All questions were answered, and informed consent                            was obtained. Prior Anticoagulants: The patient has                            taken no anticoagulant or antiplatelet agents. ASA                            Grade Assessment: II - A patient with mild systemic                            disease. After reviewing the risks and benefits,                            the patient was deemed in satisfactory condition to                            undergo the procedure.                           After obtaining informed consent, the endoscope was  passed under direct vision. Throughout the                            procedure, the patient's blood pressure, pulse, and                            oxygen saturations were monitored continuously. The                            Olympus Scope [id number] was introduced through the                            mouth, and advanced to the second part of duodenum.                            The upper GI  endoscopy was accomplished without                            difficulty. The patient tolerated the procedure                            well. Scope In: Scope Out: Findings:                 The Z-line was regular and was found 38 cm from the                            incisors.                           No gross lesions were noted in the entire esophagus.                           Patchy mild inflammation characterized by                            congestion (edema), erythema and friability was                            found in the gastric antrum and in the prepyloric                            region of the stomach. Biopsies were taken with a                            cold forceps for Helicobacter pylori testing.                           The cardia and gastric fundus were normal on                            retroflexion.                           The examined duodenum was normal. Complications:  No immediate complications. Estimated Blood Loss:     Estimated blood loss was minimal. Impression:               - Z-line regular, 38 cm from the incisors.                           - No gross lesions in the entire esophagus.                           - Gastritis. Biopsied.                           - Normal examined duodenum. Recommendation:           - Patient has a contact number available for                            emergencies. The signs and symptoms of potential                            delayed complications were discussed with the                            patient. Return to normal activities tomorrow.                            Written discharge instructions were provided to the                            patient.                           - Resume previous diet.                           - Continue present medications.                           - Await pathology results. Chelcey Caputo V. Juvencio Verdi, MD 04/08/2024 4:19:53 PM This report has been signed electronically.

## 2024-04-08 NOTE — Progress Notes (Unsigned)
 [facility] History and Physical   Primary Care Physician:  Carin Gauze, NP   Reason for Procedure:  Epigastric abdominal pain, colon cancer screening  Plan:    EGD and colonoscopy with possible interventions as needed     HPI: Brenda Bruce is a very pleasant 58 y.o. female here for here for EGD and colonoscopy for epigastric abdominal pain and colon cancer screening.   The risks and benefits as well as alternatives of endoscopic procedure(s) have been discussed and reviewed. All questions answered. The patient agrees to proceed.    Past Medical History:  Diagnosis Date   Anxiety and depression    Fatigue    Fibroid tumor    Hair loss    History of uterine fibroid    Irregular menses    Ovarian mass, right    Vitamin D deficiency     Past Surgical History:  Procedure Laterality Date   intestional tumor     removed   LAPAROSCOPY  2013   remove tumor in uterus? Dr. in [facility]    Prior to Admission medications   Medication Sig Start Date End Date Taking? Authorizing Provider  acetaminophen-codeine (TYLENOL #3) 300-30 MG tablet Take 1 tablet by mouth every 4 (four) hours as needed. 03/24/24  Yes [provider]  amoxicillin (AMOXIL) 875 MG tablet Take 875 mg by mouth 2 (two) times daily. 03/24/24  Yes [provider]  Ascorbic Acid (VITAMIN C) 1000 MG tablet Take 1,000 mg by mouth daily.   Yes [provider]  cholecalciferol (VITAMIN D) 1000 units tablet Take 1,000 Units by mouth daily.   Yes [provider]  levothyroxine  (SYNTHROID ) 50 MCG tablet Take 1 tablet (50 mcg total) by mouth daily. 10/22/23 10/21/24 Yes Scoggins, Hospital doctor, NP  Na Sulfate-K Sulfate-Mg Sulfate concentrate (SUPREP) 17.5-3.13-1.6 GM/177ML SOLN TAKE 1 KIT (354 MLS TOTAL) BY MOUTH ONCE FOR 1 DOSE. 03/11/24  Yes [provider]  Omega-3 Fatty Acids (FISH OIL) 1000 MG CAPS Take 1 capsule by mouth daily.   Yes [provider]  omeprazole   (PRILOSEC) 40 MG capsule Take 1 capsule (40 mg total) by mouth daily. 03/11/24  Yes Heinz, Camie BRAVO, PA-C  rosuvastatin  (CRESTOR ) 10 MG tablet Take 1 tablet (10 mg total) by mouth at bedtime. 10/22/23  Yes Scoggins, Amber, NP  vitamin B-12 (CYANOCOBALAMIN) 100 MCG tablet Take 100 mcg by mouth daily.   Yes [provider]  vitamin E 1000 UNIT capsule Take 1,000 Units by mouth daily.   Yes [provider]  estrogen, conjugated,-medroxyprogesterone (PREMPRO) 0.625-2.5 MG tablet Take 1 tablet by mouth daily. Patient not taking: Reported on 04/08/2024 09/30/15   Defrancesco, Gladis LABOR, MD  Multiple Vitamin (MULTI-VITAMINS) TABS Take by mouth.    [provider]    Current Outpatient Medications  Medication Sig Dispense Refill   acetaminophen-codeine (TYLENOL #3) 300-30 MG tablet Take 1 tablet by mouth every 4 (four) hours as needed.     amoxicillin (AMOXIL) 875 MG tablet Take 875 mg by mouth 2 (two) times daily.     Ascorbic Acid (VITAMIN C) 1000 MG tablet Take 1,000 mg by mouth daily.     cholecalciferol (VITAMIN D) 1000 units tablet Take 1,000 Units by mouth daily.     levothyroxine  (SYNTHROID ) 50 MCG tablet Take 1 tablet (50 mcg total) by mouth daily. 30 tablet 11   Na Sulfate-K Sulfate-Mg Sulfate concentrate (SUPREP) 17.5-3.13-1.6 GM/177ML SOLN TAKE 1 KIT (354 MLS TOTAL) BY MOUTH ONCE FOR 1 DOSE.  Omega-3 Fatty Acids (FISH OIL) 1000 MG CAPS Take 1 capsule by mouth daily.     omeprazole  (PRILOSEC) 40 MG capsule Take 1 capsule (40 mg total) by mouth daily. 90 capsule 3   rosuvastatin  (CRESTOR ) 10 MG tablet Take 1 tablet (10 mg total) by mouth at bedtime. 90 tablet 3   vitamin B-12 (CYANOCOBALAMIN) 100 MCG tablet Take 100 mcg by mouth daily.     vitamin E 1000 UNIT capsule Take 1,000 Units by mouth daily.     estrogen, conjugated,-medroxyprogesterone (PREMPRO) 0.625-2.5 MG tablet Take 1 tablet by mouth daily. (Patient not taking: Reported on 04/08/2024) 30 tablet 2   Multiple  Vitamin (MULTI-VITAMINS) TABS Take by mouth.     Current Facility-Administered Medications  Medication Dose Route Frequency Provider Last Rate Last Admin   0.9 %  sodium chloride infusion  500 mL Intravenous Once Shavy Beachem V, MD        Allergies as of 04/08/2024   (No Known Allergies)    Family History  Problem Relation Age of Onset   Breast cancer Neg Hx     Social History   Socioeconomic History   Marital status: Married    Spouse name: Not on file   Number of children: Not on file   Years of education: Not on file   Highest education level: Not on file  Occupational History   Occupation: server  Tobacco Use   Smoking status: Never   Smokeless tobacco: Never  Vaping Use   Vaping status: Never Used  Substance and Sexual Activity   Alcohol use: No   Drug use: No   Sexual activity: Yes    Birth control/protection: None  Other Topics Concern   Not on file  Social History Narrative   Not on file   Social Drivers of Health   Financial Resource Strain: Not on file  Food Insecurity: Not on file  Transportation Needs: Not on file  Physical Activity: Not on file  Stress: Not on file  Social Connections: Not on file  Intimate Partner Violence: Not on file    Review of Systems:  All other review of systems negative except as mentioned in the HPI.  Physical Exam: Vital signs in last 24 hours: BP 102/65   Pulse 76   Temp 97.8 F (36.6 C) (Temporal)   Ht 5' 1 (1.549 m)   Wt 164 lb (74.4 kg)   SpO2 98%   BMI 30.99 kg/m  General:   Alert, NAD Lungs:  Clear .   Heart:  Regular rate and rhythm Abdomen:  Soft, nontender and nondistended. Neuro/Psych:  Alert and cooperative. Normal mood and affect. A and O x 3  Reviewed labs, radiology imaging, old records and pertinent past GI work up  Patient is appropriate for planned procedure(s) and anesthesia in an ambulatory setting   K. Veena Anaiyah Anglemyer , MD [phone]

## 2024-04-09 ENCOUNTER — Telehealth: Payer: Self-pay

## 2024-04-09 NOTE — Telephone Encounter (Signed)
Post procedure follow up call, left message

## 2024-04-14 LAB — SURGICAL PATHOLOGY

## 2024-04-15 ENCOUNTER — Ambulatory Visit: Admitting: Cardiology

## 2024-04-16 ENCOUNTER — Ambulatory Visit: Payer: Self-pay | Admitting: Gastroenterology

## 2024-06-03 ENCOUNTER — Ambulatory Visit: Admitting: Cardiology

## 2024-06-24 ENCOUNTER — Ambulatory Visit: Admitting: Cardiology

## 2024-06-30 ENCOUNTER — Other Ambulatory Visit

## 2024-06-30 DIAGNOSIS — I10 Essential (primary) hypertension: Secondary | ICD-10-CM

## 2024-06-30 DIAGNOSIS — E079 Disorder of thyroid, unspecified: Secondary | ICD-10-CM

## 2024-06-30 DIAGNOSIS — E039 Hypothyroidism, unspecified: Secondary | ICD-10-CM

## 2024-06-30 DIAGNOSIS — R5383 Other fatigue: Secondary | ICD-10-CM

## 2024-06-30 DIAGNOSIS — R7303 Prediabetes: Secondary | ICD-10-CM

## 2024-06-30 DIAGNOSIS — E782 Mixed hyperlipidemia: Secondary | ICD-10-CM

## 2024-07-01 ENCOUNTER — Ambulatory Visit: Admitting: Cardiology

## 2024-07-01 ENCOUNTER — Encounter: Payer: Self-pay | Admitting: Cardiology

## 2024-07-01 ENCOUNTER — Ambulatory Visit: Payer: Self-pay | Admitting: Cardiology

## 2024-07-01 VITALS — BP 126/70 | HR 76 | Ht 61.0 in | Wt 160.6 lb

## 2024-07-01 DIAGNOSIS — E039 Hypothyroidism, unspecified: Secondary | ICD-10-CM

## 2024-07-01 DIAGNOSIS — E782 Mixed hyperlipidemia: Secondary | ICD-10-CM

## 2024-07-01 DIAGNOSIS — R7303 Prediabetes: Secondary | ICD-10-CM

## 2024-07-01 DIAGNOSIS — I10 Essential (primary) hypertension: Secondary | ICD-10-CM

## 2024-07-01 LAB — HEMOGLOBIN A1C
Est. average glucose Bld gHb Est-mCnc: 120 mg/dL
Hgb A1c MFr Bld: 5.8 % — ABNORMAL HIGH (ref 4.8–5.6)

## 2024-07-01 LAB — CMP14+EGFR
ALT: 23 IU/L (ref 0–32)
AST: 19 IU/L (ref 0–40)
Albumin: 4.6 g/dL (ref 3.8–4.9)
Alkaline Phosphatase: 88 IU/L (ref 49–135)
BUN/Creatinine Ratio: 25 (ref 12–28)
BUN: 18 mg/dL (ref 8–27)
Bilirubin Total: 0.3 mg/dL (ref 0.0–1.2)
CO2: 20 mmol/L (ref 20–29)
Calcium: 9.2 mg/dL (ref 8.7–10.3)
Chloride: 107 mmol/L — ABNORMAL HIGH (ref 96–106)
Creatinine, Ser: 0.72 mg/dL (ref 0.57–1.00)
Globulin, Total: 2.5 g/dL (ref 1.5–4.5)
Glucose: 109 mg/dL — ABNORMAL HIGH (ref 70–99)
Potassium: 4.2 mmol/L (ref 3.5–5.2)
Sodium: 146 mmol/L — ABNORMAL HIGH (ref 134–144)
Total Protein: 7.1 g/dL (ref 6.0–8.5)
eGFR: 96 mL/min/1.73

## 2024-07-01 LAB — LIPID PANEL
Chol/HDL Ratio: 6 ratio — ABNORMAL HIGH (ref 0.0–4.4)
Cholesterol, Total: 222 mg/dL — ABNORMAL HIGH (ref 100–199)
HDL: 37 mg/dL — ABNORMAL LOW
LDL Chol Calc (NIH): 146 mg/dL — ABNORMAL HIGH (ref 0–99)
Triglycerides: 216 mg/dL — ABNORMAL HIGH (ref 0–149)
VLDL Cholesterol Cal: 39 mg/dL (ref 5–40)

## 2024-07-01 LAB — TSH: TSH: 2.71 u[IU]/mL (ref 0.450–4.500)

## 2024-07-01 MED ORDER — ROSUVASTATIN CALCIUM 10 MG PO TABS: 10.0000 mg | ORAL_TABLET | Freq: Every day | ORAL | 3 refills | Status: AC

## 2024-07-01 NOTE — Progress Notes (Signed)
 "  Established Patient Office Visit  Subjective:  Patient ID: Brenda Bruce, female    DOB: [birth year]  Age: 58 y.o. MRN: [id number]  Chief Complaint  Patient presents with   Follow-up    Follow up     Patient in office for regular follow up, discuss recent lab results. Patient doing well, no complaints today.  Discussed recent lab work. LDL elevated. Patient admits to not taking statin daily, maybe twice weekly. Discussed importance of taking daily as prescribed. Hgb A1c elevated, pre diabetic, stable. Sodium elevated, decrease salt intake.  Blood pressure controlled today.  Continue current medications.     No other concerns at this time.   Past Medical History:  Diagnosis Date   Anxiety and depression    Fatigue    Fibroid tumor    Hair loss    History of uterine fibroid    Irregular menses    Ovarian mass, right    Vitamin D deficiency     Past Surgical History:  Procedure Laterality Date   intestional tumor     removed   LAPAROSCOPY  2013   remove tumor in uterus? Dr. in [facility]    Social History   Socioeconomic History   Marital status: Married    Spouse name: Not on file   Number of children: Not on file   Years of education: Not on file   Highest education level: Not on file  Occupational History   Occupation: server  Tobacco Use   Smoking status: Never   Smokeless tobacco: Never  Vaping Use   Vaping status: Never Used  Substance and Sexual Activity   Alcohol use: No   Drug use: No   Sexual activity: Yes    Birth control/protection: None  Other Topics Concern   Not on file  Social History Narrative   Not on file   Social Drivers of Health   Tobacco Use: Low Risk (07/01/2024)   Patient History    Smoking Tobacco Use: Never    Smokeless Tobacco Use: Never    Passive Exposure: Not on file  Financial Resource Strain: Not on file  Food Insecurity: Not on file  Transportation Needs: Not on file  Physical Activity: Not on file  Stress: Not on  file  Social Connections: Not on file  Intimate Partner Violence: Not on file  Depression (EYV7-0): Not on file  Alcohol Screen: Not on file  Housing: Not on file  Utilities: Not on file  Health Literacy: Not on file    Family History  Problem Relation Age of Onset   Breast cancer Neg Hx     Allergies[1]  Show/hide medication list[2]  ROS     Objective:   BP 126/70   Pulse 76   Ht 5' 1 (1.549 m)   Wt 160 lb 9.6 oz (72.8 kg)   SpO2 97%   BMI 30.35 kg/m   Vitals:   07/01/24 1319  BP: 126/70  Pulse: 76  Height: 5' 1 (1.549 m)  Weight: 160 lb 9.6 oz (72.8 kg)  SpO2: 97%  BMI (Calculated): 30.36    Physical Exam   No results found for any visits on 07/01/24.  Recent Results (from the past 2160 hours)  Surgical pathology ([facility])     Status: None   Collection Time: 04/08/24 12:00 AM  Result Value Ref Range   SURGICAL PATHOLOGY      SURGICAL PATHOLOGY [facility] [address] [place], [place] [address] Telephone (  336) [phone] or [phone] Fax [phone]  REPORT OF SURGICAL PATHOLOGY   Accession #: [id number] Patient Name: Brenda Bruce Visit # : [id number]  MRN: [id number] Physician: Shila Gustav [place] DOB/Age [birth year] (Age: 32) Gender: F Collected Date: 04/08/2024 Received Date: 04/10/2024  FINAL DIAGNOSIS       1. Surgical [P], gastric antrum and gastric body :       REACTIVE GASTROPATHY WITH MINIMAL CHRONIC GASTRITIS      NEGATIVE FOR H. PYLORI, INTESTINAL METAPLASIA, DYSPLASIA AND CARCINOMA       2. Surgical [P], colon, ascending, polyp (2) :       BENIGN COLONIC MUCOSA WITH NO DIAGNOSTIC ABNORMALITY       ELECTRONIC SIGNATURE : Picklesimer Md, Fred , Sports Administrator, International Aid/development Worker  MICROSCOPIC DESCRIPTION 2. Deeper sections were reviewed.  CASE COMMENTS STAINS USED IN DIAGNOSIS: H&E H&E *RECUT DEEPER X 9 LEVELS *RECUT DEEPER  X 9 LEVELS *RECUT DEEPER X 9  LEVELS    CLINICAL HISTORY  SPECIMEN(S) OBTAINED 1. Surgical [P], Gastric Antrum And Gastric Body 2. Surgical [P], Colon, Ascending, Polyp (2)  SPECIMEN COMMENTS: 1. Abdominal pain, epigastric; screening for colon cancer; gastritis and gastroduodenitis; benign neoplasm of ascending colon SPECIMEN CLINICAL INFORMATION: 1. R/O H.pylori 2. R/O adenoma    Gross Description 1. Received in formalin are tan, soft tissue fragments that are submitted in toto.Number: multiple, Size: 0.2 cm smallest to 0.3 cm largest, (1B) ( TA ) 2. Received in formalin are tan, soft tissue fragments that are submitted in toto.Number: 2, Size: 0.4 cm smallest to 0.5 cm largest, (1B) ( TA )        Report signed out from the following location(s) [place]. [facility] [address] [place], [place] [address] CLIA #: [id number]  [facility] [address] [place], [place] [address] CLIA #: [id number]   Lipid panel     Status: Abnormal   Collection Time: 06/30/24  8:41 AM  Result Value Ref Range   Cholesterol, Total 222 (H) 100 - 199 mg/dL   Triglycerides 783 (H) 0 - 149 mg/dL   HDL 37 (L) >60 mg/dL   VLDL Cholesterol Cal 39 5 - 40 mg/dL   LDL Chol Calc (NIH) 853 (H) 0 - 99 mg/dL   Chol/HDL Ratio 6.0 (H) 0.0 - 4.4 ratio    Comment:                                   T. Chol/HDL Ratio                                             Men  Women                               1/2 Avg.Risk  3.4    3.3                                   Avg.Risk  5.0    4.4                                2X  Avg.Risk  9.6    7.1                                3X Avg.Risk 23.4   11.0   CMP14+EGFR     Status: Abnormal   Collection Time: 06/30/24  8:41 AM  Result Value Ref Range   Glucose 109 (H) 70 - 99 mg/dL   BUN 18 8 - 27 mg/dL   Creatinine, Ser 9.27 0.57 - 1.00 mg/dL   eGFR 96 >40 fO/fpw/8.26   BUN/Creatinine Ratio 25 12 - 28   Sodium 146 (H) 134 - 144 mmol/L   Potassium 4.2 3.5 - 5.2  mmol/L   Chloride 107 (H) 96 - 106 mmol/L   CO2 20 20 - 29 mmol/L   Calcium  9.2 8.7 - 10.3 mg/dL   Total Protein 7.1 6.0 - 8.5 g/dL   Albumin 4.6 3.8 - 4.9 g/dL   Globulin, Total 2.5 1.5 - 4.5 g/dL   Bilirubin Total 0.3 0.0 - 1.2 mg/dL   Alkaline Phosphatase 88 49 - 135 IU/L   AST 19 0 - 40 IU/L   ALT 23 0 - 32 IU/L  TSH     Status: None   Collection Time: 06/30/24  8:41 AM  Result Value Ref Range   TSH 2.710 0.450 - 4.500 uIU/mL  Hemoglobin A1c     Status: Abnormal   Collection Time: 06/30/24  8:41 AM  Result Value Ref Range   Hgb A1c MFr Bld 5.8 (H) 4.8 - 5.6 %    Comment:          Prediabetes: 5.7 - 6.4          Diabetes: >6.4          Glycemic control for adults with diabetes: <7.0    Est. average glucose Bld gHb Est-mCnc 120 mg/dL      Assessment & Plan:  Decrease salt intake Take statin daily as prescribed Decrease sugar and starch intake to lower A1c Continue current medications  Problem List Items Addressed This Visit       Cardiovascular and Mediastinum   Essential hypertension, benign - Primary   Relevant Medications   rosuvastatin  (CRESTOR ) 10 MG tablet     Endocrine   Hypothyroidism, adult     Other   Prediabetes   Mixed hyperlipidemia   Relevant Medications   rosuvastatin  (CRESTOR ) 10 MG tablet    Return in about 4 months (around 10/30/2024) for fasting lab work prior.   Total time spent: 25 minutes. This time includes review of previous notes and results and patient face to face interaction during today's visit.    Jeoffrey Pollen, NP  07/01/2024   This document may have been prepared by Dragon Voice Recognition software and as such may include unintentional dictation errors.     [1] No Known Allergies [2]  Outpatient Medications Prior to Visit  Medication Sig   acetaminophen-codeine (TYLENOL #3) 300-30 MG tablet Take 1 tablet by mouth every 4 (four) hours as needed.   amoxicillin (AMOXIL) 875 MG tablet Take 875 mg by mouth 2 (two)  times daily.   Ascorbic Acid (VITAMIN C) 1000 MG tablet Take 1,000 mg by mouth daily.   cholecalciferol (VITAMIN D) 1000 units tablet Take 1,000 Units by mouth daily.   levothyroxine  (SYNTHROID ) 50 MCG tablet Take 1 tablet (50 mcg total) by mouth daily.   Multiple Vitamin (MULTI-VITAMINS) TABS Take by mouth.  Na Sulfate-K Sulfate-Mg Sulfate concentrate (SUPREP) 17.5-3.13-1.6 GM/177ML SOLN TAKE 1 KIT (354 MLS TOTAL) BY MOUTH ONCE FOR 1 DOSE.   Omega-3 Fatty Acids (FISH OIL) 1000 MG CAPS Take 1 capsule by mouth daily.   omeprazole  (PRILOSEC) 40 MG capsule Take 1 capsule (40 mg total) by mouth daily.   vitamin B-12 (CYANOCOBALAMIN) 100 MCG tablet Take 100 mcg by mouth daily.   vitamin E 1000 UNIT capsule Take 1,000 Units by mouth daily.   [DISCONTINUED] rosuvastatin  (CRESTOR ) 10 MG tablet Take 1 tablet (10 mg total) by mouth at bedtime.   estrogen, conjugated,-medroxyprogesterone (PREMPRO) 0.625-2.5 MG tablet Take 1 tablet by mouth daily. (Patient not taking: Reported on 07/01/2024)   No facility-administered medications prior to visit.   "

## 2024-07-08 ENCOUNTER — Encounter: Payer: Self-pay | Admitting: Radiology

## 2024-07-08 ENCOUNTER — Ambulatory Visit
Admission: RE | Admit: 2024-07-08 | Discharge: 2024-07-08 | Disposition: A | Discharge status: Home / Self Care | Source: Ambulatory Visit | Attending: Cardiology | Admitting: Cardiology

## 2024-07-08 DIAGNOSIS — Z1231 Encounter for screening mammogram for malignant neoplasm of breast: Secondary | ICD-10-CM | POA: Diagnosis present
